# Patient Record
Sex: Female | Born: 1955
Health system: Southern US, Community
[De-identification: ages and names within clinical notes are randomized; demographics above are authoritative.]

## PROBLEM LIST (undated history)

## (undated) DIAGNOSIS — D126 Benign neoplasm of colon, unspecified: Secondary | ICD-10-CM

## (undated) DIAGNOSIS — I10 Essential (primary) hypertension: Secondary | ICD-10-CM

## (undated) DIAGNOSIS — A599 Trichomoniasis, unspecified: Secondary | ICD-10-CM

## (undated) DIAGNOSIS — M1990 Unspecified osteoarthritis, unspecified site: Secondary | ICD-10-CM

## (undated) HISTORY — DX: Benign neoplasm of colon, unspecified: D12.6

## (undated) HISTORY — PX: WISDOM TOOTH EXTRACTION: SHX21

## (undated) HISTORY — DX: Trichomoniasis, unspecified: A59.9

## (undated) HISTORY — PX: COLONOSCOPY: SHX174

---

## 1989-02-11 HISTORY — PX: ABDOMINAL HYSTERECTOMY: SHX81

## 2008-12-23 ENCOUNTER — Other Ambulatory Visit: Admission: RE | Admit: 2008-12-23 | Discharge: 2008-12-23 | Payer: Self-pay | Admitting: Gynecology

## 2008-12-23 ENCOUNTER — Encounter: Payer: Self-pay | Admitting: Gynecology

## 2008-12-23 ENCOUNTER — Ambulatory Visit: Payer: Self-pay | Admitting: Gynecology

## 2009-01-19 ENCOUNTER — Encounter: Admission: RE | Admit: 2009-01-19 | Discharge: 2009-01-19 | Payer: Self-pay | Admitting: Gynecology

## 2009-02-11 DIAGNOSIS — D126 Benign neoplasm of colon, unspecified: Secondary | ICD-10-CM

## 2009-02-11 HISTORY — DX: Benign neoplasm of colon, unspecified: D12.6

## 2009-03-17 ENCOUNTER — Ambulatory Visit (HOSPITAL_COMMUNITY): Admission: RE | Admit: 2009-03-17 | Discharge: 2009-03-17 | Payer: Self-pay | Admitting: Gastroenterology

## 2009-04-14 ENCOUNTER — Other Ambulatory Visit: Admission: RE | Admit: 2009-04-14 | Discharge: 2009-04-14 | Payer: Self-pay | Admitting: Gynecology

## 2009-04-14 ENCOUNTER — Ambulatory Visit: Payer: Self-pay | Admitting: Gynecology

## 2009-04-19 DIAGNOSIS — A599 Trichomoniasis, unspecified: Secondary | ICD-10-CM

## 2009-04-19 HISTORY — DX: Trichomoniasis, unspecified: A59.9

## 2009-05-22 ENCOUNTER — Ambulatory Visit: Payer: Self-pay | Admitting: Gynecology

## 2009-08-01 ENCOUNTER — Ambulatory Visit: Payer: Self-pay | Admitting: Gynecology

## 2009-12-25 ENCOUNTER — Other Ambulatory Visit: Admission: RE | Admit: 2009-12-25 | Discharge: 2009-12-25 | Payer: Self-pay | Admitting: Gynecology

## 2009-12-25 ENCOUNTER — Ambulatory Visit: Payer: Self-pay | Admitting: Gynecology

## 2010-01-22 ENCOUNTER — Encounter
Admission: RE | Admit: 2010-01-22 | Discharge: 2010-01-22 | Payer: Self-pay | Source: Home / Self Care | Attending: Gynecology | Admitting: Gynecology

## 2010-05-16 ENCOUNTER — Other Ambulatory Visit: Payer: Self-pay | Admitting: Gynecology

## 2010-05-16 ENCOUNTER — Ambulatory Visit (INDEPENDENT_AMBULATORY_CARE_PROVIDER_SITE_OTHER): Payer: 59 | Admitting: Gynecology

## 2010-05-16 DIAGNOSIS — N644 Mastodynia: Secondary | ICD-10-CM

## 2010-05-23 ENCOUNTER — Ambulatory Visit
Admission: RE | Admit: 2010-05-23 | Discharge: 2010-05-23 | Disposition: A | Discharge status: Home / Self Care | Payer: 59 | Source: Ambulatory Visit | Attending: Gynecology | Admitting: Gynecology

## 2010-05-23 DIAGNOSIS — N644 Mastodynia: Secondary | ICD-10-CM

## 2010-12-26 ENCOUNTER — Encounter: Payer: Self-pay | Admitting: Anesthesiology

## 2011-01-04 ENCOUNTER — Other Ambulatory Visit: Payer: Self-pay | Admitting: Gynecology

## 2011-01-04 DIAGNOSIS — Z1231 Encounter for screening mammogram for malignant neoplasm of breast: Secondary | ICD-10-CM

## 2011-01-07 ENCOUNTER — Other Ambulatory Visit (HOSPITAL_COMMUNITY)
Admission: RE | Admit: 2011-01-07 | Discharge: 2011-01-07 | Disposition: A | Discharge status: Home / Self Care | Payer: 59 | Source: Ambulatory Visit | Attending: Gynecology | Admitting: Gynecology

## 2011-01-07 ENCOUNTER — Encounter: Payer: Self-pay | Admitting: Gynecology

## 2011-01-07 ENCOUNTER — Ambulatory Visit (INDEPENDENT_AMBULATORY_CARE_PROVIDER_SITE_OTHER): Payer: 59 | Admitting: Gynecology

## 2011-01-07 DIAGNOSIS — R635 Abnormal weight gain: Secondary | ICD-10-CM

## 2011-01-07 DIAGNOSIS — Z860100 Personal history of colon polyps, unspecified: Secondary | ICD-10-CM

## 2011-01-07 DIAGNOSIS — Z1211 Encounter for screening for malignant neoplasm of colon: Secondary | ICD-10-CM

## 2011-01-07 DIAGNOSIS — Z23 Encounter for immunization: Secondary | ICD-10-CM

## 2011-01-07 DIAGNOSIS — K644 Residual hemorrhoidal skin tags: Secondary | ICD-10-CM

## 2011-01-07 DIAGNOSIS — Z01419 Encounter for gynecological examination (general) (routine) without abnormal findings: Secondary | ICD-10-CM | POA: Insufficient documentation

## 2011-01-07 DIAGNOSIS — E663 Overweight: Secondary | ICD-10-CM

## 2011-01-07 DIAGNOSIS — N951 Menopausal and female climacteric states: Secondary | ICD-10-CM

## 2011-01-07 DIAGNOSIS — E7800 Pure hypercholesterolemia, unspecified: Secondary | ICD-10-CM

## 2011-01-07 DIAGNOSIS — M533 Sacrococcygeal disorders, not elsewhere classified: Secondary | ICD-10-CM

## 2011-01-07 DIAGNOSIS — M5387 Other specified dorsopathies, lumbosacral region: Secondary | ICD-10-CM

## 2011-01-07 DIAGNOSIS — I10 Essential (primary) hypertension: Secondary | ICD-10-CM

## 2011-01-07 LAB — COMPREHENSIVE METABOLIC PANEL
ALT: 12 U/L (ref 0–35)
AST: 20 U/L (ref 0–37)
Albumin: 4.3 g/dL (ref 3.5–5.2)
Alkaline Phosphatase: 50 U/L (ref 39–117)
BUN: 15 mg/dL (ref 6–23)
CO2: 25 mEq/L (ref 19–32)
Calcium: 9 mg/dL (ref 8.4–10.5)
Chloride: 108 mEq/L (ref 96–112)
Creat: 0.88 mg/dL (ref 0.50–1.10)
Glucose, Bld: 98 mg/dL (ref 70–99)
Potassium: 4.3 mEq/L (ref 3.5–5.3)
Sodium: 142 mEq/L (ref 135–145)
Total Bilirubin: 0.6 mg/dL (ref 0.3–1.2)
Total Protein: 6.7 g/dL (ref 6.0–8.3)

## 2011-01-07 MED ORDER — CYCLOBENZAPRINE HCL 10 MG PO TABS: 10.0000 mg | ORAL_TABLET | Freq: Two times a day (BID) | ORAL | Status: AC | PRN

## 2011-01-07 NOTE — Progress Notes (Signed)
Addended by: Bertram Savin A on: 01/07/2011 02:55 PM   Modules accepted: Orders

## 2011-01-07 NOTE — Progress Notes (Signed)
Belinda Fowler October 06, 1955 [id number]   History:    55 y.o.  for annual exam with history of lumbosacral discomfort which has helped with the Flexeril that she takes in frequently. Dr. Clovis Riley is her primary physician which she's on July does not recall she had the lab work done. Patient with past history of total, hysterectomy with left salpingo-oophorectomy for fibroids. She also stated several years prior to that she has had history of dysplasia. She also had history trichomoniasis in 2011. Patient scheduled for mammogram next month. Patient does her monthly self breast examination. Her last bone density study was in 2010 she'll need to schedule here in the next few weeks. Review of her record again she was weighing 207 is up to 14. She is taking her calcium and vitamin D twice a day. She is also taking supplemental potassium 75 mg which she takes daily which has helped her like cramps. (Since she cannot eat bananas or several different types of fruits.)  Past medical history,surgical history, family history and social history were all reviewed and documented in the EPIC chart.  Gynecologic History Patient's last menstrual period was 01/06/1990. Contraception: Hysterectomy Last Pap: 2011. Results were: normal Last mammogram: 2011. Results were: normal  Obstetric History OB History    Grav Para Term Preterm Abortions TAB SAB Ect Mult Living   2 1 1  1  1   1      # Outc Date GA Lbr Len/2nd Wgt Sex Del Anes PTL Lv   1 TRM     F SVD  No Yes   2 SAB                ROS:  Was performed and pertinent positives and negatives are included in the history.  Exam: chaperone present  BP 132/90  Ht 5' 5.5" (1.664 m)  Wt 214 lb (97.07 kg)  BMI 35.07 kg/m2  LMP 01/06/1990  Body mass index is 35.07 kg/(m^2).  General appearance : Well developed well nourished female. No acute distress HEENT: Neck supple, trachea midline, no carotid bruits, no thyroidmegaly Lungs: Clear to auscultation,  no rhonchi or wheezes, or rib retractions  Heart: Regular rate and rhythm, no murmurs or gallops Breast:Examined in sitting and supine position were symmetrical in appearance, no palpable masses or tenderness,  no skin retraction, no nipple inversion, no nipple discharge, no skin discoloration, no axillary or supraclavicular lymphadenopathy Abdomen: no palpable masses or tenderness, no rebound or guarding Extremities: no edema or skin discoloration or tenderness  Pelvic:  Bartholin, Urethra, Skene Glands: Within normal limits             Vagina: No gross lesions or discharge  Cervix: Absent  Uterus  absent                Anus non thrombosed external hemorrhoids  Rectovaginal  normal sphincter tone without palpated masses or tenderness             Hemoccult Hemoccult packet was given to the patient is made to the office at a later date     Assessment/Plan:  55 y.o. female for annual exam overweight mildly hypertensive we'll need to follow up with her primary physician Dr. Clovis Riley. Repeat blood pressure 136/92. The following labs will be drawn today: Comprehensive metabolic panel, CBC, total cholesterol, TSH, urinalysis, and Pap smear. She was encouraged to continue her monthly self breast examination. She is to follow for mammogram next month. Her next colonoscopy is in 2 years. She is  to submit the fecal occult blood testing cards to the office for testing. She will schedule her bone density study as well. We discussed importance of lifestyle modification and regular exercise and balanced diet. She was encouraged to continue to take her calcium and vitamin D for osteoporosis as well. Patient has done well on the Divigel 0.5 mg (1%) which she applies transdermally to one arm daily. For her lumbosacral strain she was given a prescription for Flexeril 10 mg to take 1 by mouth daily to twice a day when necessary. Patient knows about the women's health initiative study and small risk of breast cancer in  patients on hormone replacement therapy. She fully understands that we'll keep her on low dose for a short amount of time i and then eventually begin to taper her off.   Ok Edwards MD, 11:45 AM 01/07/2011

## 2011-01-11 NOTE — Progress Notes (Signed)
Addended by: Aura Camps on: 01/11/2011 09:10 AM   Modules accepted: Orders

## 2011-01-18 ENCOUNTER — Ambulatory Visit (INDEPENDENT_AMBULATORY_CARE_PROVIDER_SITE_OTHER): Payer: 59 | Admitting: Gynecology

## 2011-01-18 DIAGNOSIS — Z1211 Encounter for screening for malignant neoplasm of colon: Secondary | ICD-10-CM

## 2011-01-18 DIAGNOSIS — E7800 Pure hypercholesterolemia, unspecified: Secondary | ICD-10-CM

## 2011-01-18 LAB — POC HEMOCCULT BLD/STL (OFFICE/1-CARD/DIAGNOSTIC): Fecal Occult Blood, POC: NEGATIVE

## 2011-01-31 ENCOUNTER — Ambulatory Visit
Admission: RE | Admit: 2011-01-31 | Discharge: 2011-01-31 | Disposition: A | Discharge status: Home / Self Care | Payer: 59 | Source: Ambulatory Visit | Attending: Gynecology | Admitting: Gynecology

## 2011-01-31 ENCOUNTER — Ambulatory Visit (INDEPENDENT_AMBULATORY_CARE_PROVIDER_SITE_OTHER): Payer: 59

## 2011-01-31 DIAGNOSIS — Z13820 Encounter for screening for osteoporosis: Secondary | ICD-10-CM

## 2011-01-31 DIAGNOSIS — N951 Menopausal and female climacteric states: Secondary | ICD-10-CM

## 2011-01-31 DIAGNOSIS — Z1231 Encounter for screening mammogram for malignant neoplasm of breast: Secondary | ICD-10-CM

## 2011-04-10 ENCOUNTER — Other Ambulatory Visit: Payer: Self-pay | Admitting: *Deleted

## 2011-04-10 MED ORDER — CYCLOBENZAPRINE HCL 10 MG PO TABS: 10.0000 mg | ORAL_TABLET | Freq: Two times a day (BID) | ORAL | Status: AC | PRN

## 2011-05-22 ENCOUNTER — Other Ambulatory Visit: Payer: Self-pay | Admitting: Gynecology

## 2011-12-30 ENCOUNTER — Other Ambulatory Visit: Payer: Self-pay | Admitting: Gynecology

## 2011-12-30 DIAGNOSIS — Z1231 Encounter for screening mammogram for malignant neoplasm of breast: Secondary | ICD-10-CM

## 2012-01-16 ENCOUNTER — Ambulatory Visit (INDEPENDENT_AMBULATORY_CARE_PROVIDER_SITE_OTHER): Payer: 59 | Admitting: Gynecology

## 2012-01-16 ENCOUNTER — Encounter: Payer: 59 | Admitting: Gynecology

## 2012-01-16 ENCOUNTER — Encounter: Payer: Self-pay | Admitting: Gynecology

## 2012-01-16 VITALS — BP 146/94 | Ht 65.25 in | Wt 209.0 lb

## 2012-01-16 DIAGNOSIS — Z01419 Encounter for gynecological examination (general) (routine) without abnormal findings: Secondary | ICD-10-CM

## 2012-01-16 DIAGNOSIS — E663 Overweight: Secondary | ICD-10-CM

## 2012-01-16 DIAGNOSIS — N952 Postmenopausal atrophic vaginitis: Secondary | ICD-10-CM

## 2012-01-16 DIAGNOSIS — Z79890 Hormone replacement therapy (postmenopausal): Secondary | ICD-10-CM

## 2012-01-16 DIAGNOSIS — I10 Essential (primary) hypertension: Secondary | ICD-10-CM | POA: Insufficient documentation

## 2012-01-16 MED ORDER — NONFORMULARY OR COMPOUNDED ITEM: Status: DC

## 2012-01-16 MED ORDER — HYDROCHLOROTHIAZIDE 12.5 MG PO CAPS: 12.5000 mg | ORAL_CAPSULE | Freq: Every day | ORAL | Status: DC

## 2012-01-16 NOTE — Addendum Note (Signed)
Addended by: Ok Edwards on: 01/16/2012 11:28 AM   Modules accepted: Orders

## 2012-01-16 NOTE — Progress Notes (Addendum)
Belinda Fowler [birth year] [id number]   History:    56 y.o.  for annual gyn exam with no complaints. Her primary physician is Dr. Clovis Riley who did her lab work in September of this year. Review of patient's records indicated she had colonic polyps in 2011 and will be scheduled for followup next year. She stopped the Divigel 4 months ago due to the cost. She does have vaginal dryness and irritation in minimal vasomotor symptoms. She is taking calcium and vitamin D twice a day for osteoporosis prevention and her last bone density study was in 2012. She scheduled for followup mammogram in January of this coming year. Patient stated that before she began seeing me back in 1981 she had cryo-of her cervix for mild dysplasia but subsequent Pap smears have been normal. Patient has a history of total abdominal hysterectomy for fibroids several years ago at another facility but patient does not remember which ovary was removed or not. Her blood pressure today was found to be elevated as follows 146/94 and on repeat 140/92. She has had history of borderline hypertension and has strong family history as well as cardiovascular disease.  Past medical history,surgical history, family history and social history were all reviewed and documented in the EPIC chart.  Gynecologic History Patient's last menstrual period was 01/06/1990. Contraception: status post hysterectomy Last Pap: 2012. Results were: normal Last mammogram: 2012. Results were: normal  Obstetric History OB History    Grav Para Term Preterm Abortions TAB SAB Ect Mult Living   2 1 1  1  1   1      # Outc Date GA Lbr Len/2nd Wgt Sex Del Anes PTL Lv   1 TRM     F SVD  No Yes   2 SAB                ROS: A ROS was performed and pertinent positives and negatives are included in the history.  GENERAL: No fevers or chills. HEENT: No change in vision, no earache, sore throat or sinus congestion. NECK: No pain or stiffness. CARDIOVASCULAR: No chest  pain or pressure. No palpitations. PULMONARY: No shortness of breath, cough or wheeze. GASTROINTESTINAL: No abdominal pain, nausea, vomiting or diarrhea, melena or bright red blood per rectum. GENITOURINARY: No urinary frequency, urgency, hesitancy or dysuria. MUSCULOSKELETAL: No joint or muscle pain, no back pain, no recent trauma. DERMATOLOGIC: No rash, no itching, no lesions. ENDOCRINE: No polyuria, polydipsia, no heat or cold intolerance. No recent change in weight. HEMATOLOGICAL: No anemia or easy bruising or bleeding. NEUROLOGIC: No headache, seizures, numbness, tingling or weakness. PSYCHIATRIC: No depression, no loss of interest in normal activity or change in sleep pattern.     Exam: chaperone present  BP 146/94  Ht 5' 5.25" (1.657 m)  Wt 209 lb (94.802 kg)  BMI 34.51 kg/m2  LMP 01/06/1990  Body mass index is 34.51 kg/(m^2).  General appearance : Well developed well nourished female. No acute distress HEENT: Neck supple, trachea midline, no carotid bruits, no thyroidmegaly Lungs: Clear to auscultation, no rhonchi or wheezes, or rib retractions  Heart: Regular rate and rhythm, no murmurs or gallops Breast:Examined in sitting and supine position were symmetrical in appearance, no palpable masses or tenderness,  no skin retraction, no nipple inversion, no nipple discharge, no skin discoloration, no axillary or supraclavicular lymphadenopathy Abdomen: no palpable masses or tenderness, no rebound or guarding Extremities: no edema or skin discoloration or tenderness  Pelvic:  Bartholin, Urethra, Skene Glands: Within normal limits  Vagina: No gross lesions or discharge  Cervix: Absent  Uterus  absent  Adnexa  Without masses or tenderness  Anus and perineum  normal   Rectovaginal  normal sphincter tone without palpated masses or tenderness             Hemoccult card provided     Assessment/Plan:  56 y.o. female for annual exam who is overweight with a BMI 34.51 and  hypertensive. We are going to start her on HCTZ 12.5 mg daily. I've asked her to maintain a log of her blood pressure readings daily and to followup with her internist Dr. Clovis Riley next week. No Pap smear done today. New Pap smear screening guidelines discussed. Limited pelvic exam due to patient's abdominal girth and slight vaginismus son ultrasound will be scheduled the next couple weeks. We'll schedule a bone density study at the end of next year. She was reminded to followup with her mammogram. Literature information on cholesterol-lowering diet and exercise was provided. Patient's flu vaccine up-to-date. Patient will be started on estradiol 0.02% 1 mL pre-field applicator to apply twice a week vaginally for atrophy. We discussed the women's health initiative study and slight risk of breast cancer in patients on any form of estrogen replacement therapy patient understands and accepts.    Ok Edwards MD, 11:21 AM 01/16/2012

## 2012-01-16 NOTE — Patient Instructions (Addendum)
Cholesterol Control Diet  Cholesterol levels in your body are determined significantly by your diet. Cholesterol levels may also be related to heart disease. The following material helps to explain this relationship and discusses what you can do to help keep your heart healthy. Not all cholesterol is bad. Low-density lipoprotein (LDL) cholesterol is the "bad" cholesterol. It may cause fatty deposits to build up inside your arteries. High-density lipoprotein (HDL) cholesterol is "good." It helps to remove the "bad" LDL cholesterol from your blood. Cholesterol is a very important risk factor for heart disease. Other risk factors are high blood pressure, smoking, stress, heredity, and weight. The heart muscle gets its supply of blood through the coronary arteries. If your LDL cholesterol is high and your HDL cholesterol is low, you are at risk for having fatty deposits build up in your coronary arteries. This leaves less room through which blood can flow. Without sufficient blood and oxygen, the heart muscle cannot function properly and you may feel chest pains (angina pectoris). When a coronary artery closes up entirely, a part of the heart muscle may die, causing a heart attack (myocardial infarction). CHECKING CHOLESTEROL When your caregiver sends your blood to a lab to be analyzed for cholesterol, a complete lipid (fat) profile may be done. With this test, the total amount of cholesterol and levels of LDL and HDL are determined. Triglycerides are a type of fat that circulates in the blood and can also be used to determine heart disease risk. The list below describes what the numbers should be: Test: Total Cholesterol.  Less than 200 mg/dl.  Test: LDL "bad cholesterol."  Less than 100 mg/dl.   Less than 70 mg/dl if you are at very high risk of a heart attack or sudden cardiac death.  Test: HDL "good cholesterol."  Greater than 50 mg/dl for women.    Greater than 40 mg/dl for men.  Test: Triglycerides.  Less than 150 mg/dl.  CONTROLLING CHOLESTEROL WITH DIET Although exercise and lifestyle factors are important, your diet is key. That is because certain foods are known to raise cholesterol and others to lower it. The goal is to balance foods for their effect on cholesterol and more importantly, to replace saturated and trans fat with other types of fat, such as monounsaturated fat, polyunsaturated fat, and omega-3 fatty acids. On average, a person should consume no more than 15 to 17 g of saturated fat daily. Saturated and trans fats are considered "bad" fats, and they will raise LDL cholesterol. Saturated fats are primarily found in animal products such as meats, butter, and cream. However, that does not mean you need to sacrifice all your favorite foods. Today, there are good tasting, low-fat, low-cholesterol substitutes for most of the things you like to eat. Choose low-fat or nonfat alternatives. Choose round or loin cuts of red meat, since these types of cuts are lowest in fat and cholesterol. Chicken (without the skin), fish, veal, and ground [place] breast are excellent choices. Eliminate fatty meats, such as hot dogs and salami. Even shellfish have little or no saturated fat. Have a 3 oz (85 g) portion when you eat lean meat, poultry, or fish. Trans fats are also called "partially hydrogenated oils." They are oils that have been scientifically manipulated so that they are solid at room temperature resulting in a longer shelf life and improved taste and texture of foods in which they are added. Trans fats are found in stick margarine, some tub margarines, cookies, crackers, and baked goods.  When baking and cooking, oils are an excellent substitute for butter. The monounsaturated oils are especially beneficial since it is believed they lower LDL and raise HDL. The oils you should avoid entirely are saturated tropical oils, such as coconut and  palm.  Remember to eat liberally from food groups that are naturally free of saturated and trans fat, including fish, fruit, vegetables, beans, grains (barley, rice, couscous, bulgur wheat), and pasta (without cream sauces).  IDENTIFYING FOODS THAT LOWER CHOLESTEROL  Soluble fiber may lower your cholesterol. This type of fiber is found in fruits such as apples, vegetables such as broccoli, potatoes, and carrots, legumes such as beans, peas, and lentils, and grains such as barley. Foods fortified with plant sterols (phytosterol) may also lower cholesterol. You should eat at least 2 g per day of these foods for a cholesterol lowering effect.  Read package labels to identify low-saturated fats, trans fats free, and low-fat foods at the supermarket. Select cheeses that have only 2 to 3 g saturated fat per ounce. Use a heart-healthy tub margarine that is free of trans fats or partially hydrogenated oil. When buying baked goods (cookies, crackers), avoid partially hydrogenated oils. Breads and muffins should be made from whole grains (whole-wheat or whole oat flour, instead of "flour" or "enriched flour"). Buy non-creamy canned soups with reduced salt and no added fats.  FOOD PREPARATION TECHNIQUES  Never deep-fry. If you must fry, either stir-fry, which uses very little fat, or use non-stick cooking sprays. When possible, broil, bake, or roast meats, and steam vegetables. Instead of dressing vegetables with butter or margarine, use lemon and herbs, applesauce and cinnamon (for squash and sweet potatoes), nonfat yogurt, salsa, and low-fat dressings for salads.  LOW-SATURATED FAT / LOW-FAT FOOD SUBSTITUTES Meats / Saturated Fat (g)  Avoid: Steak, marbled (3 oz/85 g) / 11 g   Choose: Steak, lean (3 oz/85 g) / 4 g   Avoid: Hamburger (3 oz/85 g) / 7 g   Choose: Hamburger, lean (3 oz/85 g) / 5 g   Avoid: Ham (3 oz/85 g) / 6 g   Choose: Ham, lean cut (3 oz/85 g) / 2.4 g   Avoid: Chicken, with skin, dark  meat (3 oz/85 g) / 4 g   Choose: Chicken, skin removed, dark meat (3 oz/85 g) / 2 g   Avoid: Chicken, with skin, light meat (3 oz/85 g) / 2.5 g   Choose: Chicken, skin removed, light meat (3 oz/85 g) / 1 g  Dairy / Saturated Fat (g)  Avoid: Whole milk (1 cup) / 5 g   Choose: Low-fat milk, 2% (1 cup) / 3 g   Choose: Low-fat milk, 1% (1 cup) / 1.5 g   Choose: Skim milk (1 cup) / 0.3 g   Avoid: Hard cheese (1 oz/28 g) / 6 g   Choose: Skim milk cheese (1 oz/28 g) / 2 to 3 g   Avoid: Cottage cheese, 4% fat (1 cup) / 6.5 g   Choose: Low-fat cottage cheese, 1% fat (1 cup) / 1.5 g   Avoid: Ice cream (1 cup) / 9 g   Choose: Sherbet (1 cup) / 2.5 g   Choose: Nonfat frozen yogurt (1 cup) / 0.3 g   Choose: Frozen fruit bar / trace   Avoid: Whipped cream (1 tbs) / 3.5 g   Choose: Nondairy whipped topping (1 tbs) / 1 g  Condiments / Saturated Fat (g)  Avoid: Mayonnaise (1 tbs) / 2 g   Choose: Low-fat  mayonnaise (1 tbs) / 1 g   Avoid: Butter (1 tbs) / 7 g   Choose: Extra light margarine (1 tbs) / 1 g   Avoid: Coconut oil (1 tbs) / 11.8 g   Choose: Olive oil (1 tbs) / 1.8 g   Choose: Corn oil (1 tbs) / 1.7 g   Choose: Safflower oil (1 tbs) / 1.2 g   Choose: Sunflower oil (1 tbs) / 1.4 g   Choose: Soybean oil (1 tbs) / 2.4 g   Choose: Canola oil (1 tbs) / 1 g  Document Released: 01/28/2005 Document Revised: 10/10/2010 Document Reviewed: 07/19/2010 [facility] Patient Information 2012 [place], [place].  Exercise to Lose Weight Exercise and a healthy diet may help you lose weight. Your doctor may suggest specific exercises. EXERCISE IDEAS AND TIPS  Choose low-cost things you enjoy doing, such as walking, bicycling, or exercising to workout videos.   Take stairs instead of the elevator.   Walk during your lunch break.   Park your car further away from work or school.   Go to a gym or an exercise class.   Start with 5 to 10 minutes of exercise each day. Build up to  30 minutes of exercise 4 to 6 days a week.   Wear shoes with good support and comfortable clothes.   Stretch before and after working out.   Work out until you breathe harder and your heart beats faster.   Drink extra water when you exercise.   Do not do so much that you hurt yourself, feel dizzy, or get very short of breath.  Exercises that burn about 150 calories:  Running 1  miles in 15 minutes.   Playing volleyball for 45 to 60 minutes.   Washing and waxing a car for 45 to 60 minutes.   Playing touch football for 45 minutes.   Walking 1  miles in 35 minutes.   Pushing a stroller 1  miles in 30 minutes.   Playing basketball for 30 minutes.   Raking leaves for 30 minutes.   Bicycling 5 miles in 30 minutes.   Walking 2 miles in 30 minutes.   Dancing for 30 minutes.   Shoveling snow for 15 minutes.   Swimming laps for 20 minutes.   Walking up stairs for 15 minutes.   Bicycling 4 miles in 15 minutes.   Gardening for 30 to 45 minutes.   Jumping rope for 15 minutes.   Washing windows or floors for 45 to 60 minutes.  Document Released: 03/02/2010 Document Revised: 10/10/2010 Document Reviewed: 03/02/2010 [facility] Patient Information 2012 [place], [place]. Hypertension As your heart beats, it forces blood through your arteries. This force is your blood pressure. If the pressure is too high, it is called hypertension (HTN) or high blood pressure. HTN is dangerous because you may have it and not know it. High blood pressure may mean that your heart has to work harder to pump blood. Your arteries may be narrow or stiff. The extra work puts you at risk for heart disease, stroke, and other problems.  Blood pressure consists of two numbers, a higher number over a lower, 110/72, for example. It is stated as "110 over 72." The ideal is below 120 for the top number (systolic) and under 80 for the bottom (diastolic). Write down your blood pressure today. You should pay close  attention to your blood pressure if you have certain conditions such as:  Heart failure.  Prior heart attack.  Diabetes  Chronic kidney  disease.  Prior stroke.  Multiple risk factors for heart disease. To see if you have HTN, your blood pressure should be measured while you are seated with your arm held at the level of the heart. It should be measured at least twice. A one-time elevated blood pressure reading (especially in the Emergency Department) does not mean that you need treatment. There may be conditions in which the blood pressure is different between your right and left arms. It is important to see your caregiver soon for a recheck. Most people have essential hypertension which means that there is not a specific cause. This type of high blood pressure may be lowered by changing lifestyle factors such as:  Stress.  Smoking.  Lack of exercise.  Excessive weight.  Drug/tobacco/alcohol use.  Eating less salt. Most people do not have symptoms from high blood pressure until it has caused damage to the body. Effective treatment can often prevent, delay or reduce that damage. TREATMENT  When a cause has been identified, treatment for high blood pressure is directed at the cause. There are a large number of medications to treat HTN. These fall into several categories, and your caregiver will help you select the medicines that are best for you. Medications may have side effects. You should review side effects with your caregiver. If your blood pressure stays high after you have made lifestyle changes or started on medicines,   Your medication(s) may need to be changed.  Other problems may need to be addressed.  Be certain you understand your prescriptions, and know how and when to take your medicine.  Be sure to follow up with your caregiver within the time frame advised (usually within two weeks) to have your blood pressure rechecked and to review your medications.  If you are  taking more than one medicine to lower your blood pressure, make sure you know how and at what times they should be taken. Taking two medicines at the same time can result in blood pressure that is too low. SEEK IMMEDIATE MEDICAL CARE IF:  You develop a severe headache, blurred or changing vision, or confusion.  You have unusual weakness or numbness, or a faint feeling.  You have severe chest or abdominal pain, vomiting, or breathing problems. MAKE SURE YOU:   Understand these instructions.  Will watch your condition.  Will get help right away if you are not doing well or get worse. Document Released: 01/28/2005 Document Revised: 04/22/2011 Document Reviewed: 09/18/2007 [facility] Patient Information 2013 [place], [place].

## 2012-01-24 ENCOUNTER — Ambulatory Visit (INDEPENDENT_AMBULATORY_CARE_PROVIDER_SITE_OTHER): Payer: 59

## 2012-01-24 ENCOUNTER — Encounter: Payer: Self-pay | Admitting: Gynecology

## 2012-01-24 ENCOUNTER — Ambulatory Visit (INDEPENDENT_AMBULATORY_CARE_PROVIDER_SITE_OTHER): Payer: 59 | Admitting: Gynecology

## 2012-01-24 VITALS — BP 120/78

## 2012-01-24 DIAGNOSIS — Q5039 Other congenital malformation of ovary: Secondary | ICD-10-CM

## 2012-01-24 DIAGNOSIS — N942 Vaginismus: Secondary | ICD-10-CM

## 2012-01-24 DIAGNOSIS — E663 Overweight: Secondary | ICD-10-CM

## 2012-01-24 NOTE — Progress Notes (Signed)
Patient is a 56 year old was seen the office for annual exam on December 5 see previous encounter note for details. She is here today for followup ultrasound due to the fact that at the time of her annual exam due to her vaginismus a thorough pelvic exam was not possible. Several years ago she had had a hysterectomy in another facility and had her right tube and ovary removed.  Ultrasound today vaginal cuff and right adnexa are normal. Absence of right ovary. 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  The findings were discussed with patient. This appears to be small benign calcifications on her left ovary but nevertheless I would life her to followup with an ultrasound in 6 months

## 2012-02-12 DIAGNOSIS — Z1211 Encounter for screening for malignant neoplasm of colon: Secondary | ICD-10-CM

## 2012-02-13 ENCOUNTER — Ambulatory Visit
Admission: RE | Admit: 2012-02-13 | Discharge: 2012-02-13 | Disposition: A | Discharge status: Home / Self Care | Payer: 59 | Source: Ambulatory Visit | Attending: Gynecology | Admitting: Gynecology

## 2012-02-13 ENCOUNTER — Other Ambulatory Visit: Payer: Self-pay | Admitting: Anesthesiology

## 2012-02-13 DIAGNOSIS — Z1231 Encounter for screening mammogram for malignant neoplasm of breast: Secondary | ICD-10-CM

## 2012-02-13 DIAGNOSIS — Z1211 Encounter for screening for malignant neoplasm of colon: Secondary | ICD-10-CM

## 2012-02-17 ENCOUNTER — Other Ambulatory Visit: Payer: Self-pay | Admitting: Gynecology

## 2012-02-17 DIAGNOSIS — Z1211 Encounter for screening for malignant neoplasm of colon: Secondary | ICD-10-CM

## 2013-01-26 ENCOUNTER — Other Ambulatory Visit: Payer: Self-pay

## 2013-01-26 DIAGNOSIS — Z1231 Encounter for screening mammogram for malignant neoplasm of breast: Secondary | ICD-10-CM

## 2013-02-16 ENCOUNTER — Ambulatory Visit
Admission: RE | Admit: 2013-02-16 | Discharge: 2013-02-16 | Disposition: A | Discharge status: Home / Self Care | Payer: 59 | Source: Ambulatory Visit

## 2013-02-16 DIAGNOSIS — Z1231 Encounter for screening mammogram for malignant neoplasm of breast: Secondary | ICD-10-CM

## 2013-02-26 ENCOUNTER — Encounter: Payer: Self-pay | Admitting: Gynecology

## 2013-02-26 ENCOUNTER — Ambulatory Visit (INDEPENDENT_AMBULATORY_CARE_PROVIDER_SITE_OTHER): Payer: 59 | Admitting: Gynecology

## 2013-02-26 VITALS — BP 142/90 | Ht 65.25 in | Wt 186.2 lb

## 2013-02-26 DIAGNOSIS — Z01419 Encounter for gynecological examination (general) (routine) without abnormal findings: Secondary | ICD-10-CM

## 2013-02-26 DIAGNOSIS — N952 Postmenopausal atrophic vaginitis: Secondary | ICD-10-CM

## 2013-02-26 DIAGNOSIS — Z780 Asymptomatic menopausal state: Secondary | ICD-10-CM

## 2013-02-26 DIAGNOSIS — N951 Menopausal and female climacteric states: Secondary | ICD-10-CM

## 2013-02-26 MED ORDER — HYDROCHLOROTHIAZIDE 12.5 MG PO CAPS: 12.5000 mg | ORAL_CAPSULE | Freq: Every day | ORAL | Status: DC

## 2013-02-26 MED ORDER — NONFORMULARY OR COMPOUNDED ITEM: Status: DC

## 2013-02-26 NOTE — Progress Notes (Signed)
Belinda Fowler 1955/12/09 [id number]   History:    58 y.o.  for annual gyn exam with no major complaints today. Patient has been followed by Dr. Donnie Coffin her PCP for which she is scheduled to have her blood work drawn next week. Patient has been actively exercising and eating healthier she was weighing 209 pounds last year and his weight 186 pounds today. Review of patient's records indicated that she has a history of colonic polyps removed in 2011. She is currently doing well on estradiol 0.02% vaginal cream twice a week for vaginal atrophy and now has no weighs a motor symptoms.Patient stated that before she began seeing me back in 1981 she had cryo-of her cervix for mild dysplasia but subsequent Pap smears have been normal. Patient has a history of total abdominal hysterectomy for fibroids several years ago at another facility but patient does not remember which ovary was removed or not.  Patient had an ultrasound 01/23/2002 the following was noted: vaginal cuff and right adnexa are normal. Absence of right ovary. 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  The patient's flu vaccine is up-to-date.  Past medical history,surgical history, family history and social history were all reviewed and documented in the EPIC chart.  Gynecologic History Patient's last menstrual period was 01/06/1990. Contraception: status post hysterectomy Last Pap: 2012. Results were: normal Last mammogram: 2015. Results were: normal  Obstetric History OB History  Gravida Para Term Preterm AB SAB TAB Ectopic Multiple Living  2 1 1  1 1    1     # Outcome Date GA Lbr Len/2nd Weight Sex Delivery Anes PTL Lv  2 SAB           1 TRM     F SVD  N Y       ROS: A ROS was performed and pertinent positives and negatives are included in the history.  GENERAL: No fevers or chills. HEENT: No change in vision, no earache, sore throat or sinus  congestion. NECK: No pain or stiffness. CARDIOVASCULAR: No chest pain or pressure. No palpitations. PULMONARY: No shortness of breath, cough or wheeze. GASTROINTESTINAL: No abdominal pain, nausea, vomiting or diarrhea, melena or bright red blood per rectum. GENITOURINARY: No urinary frequency, urgency, hesitancy or dysuria. MUSCULOSKELETAL: No joint or muscle pain, no back pain, no recent trauma. DERMATOLOGIC: No rash, no itching, no lesions. ENDOCRINE: No polyuria, polydipsia, no heat or cold intolerance. No recent change in weight. HEMATOLOGICAL: No anemia or easy bruising or bleeding. NEUROLOGIC: No headache, seizures, numbness, tingling or weakness. PSYCHIATRIC: No depression, no loss of interest in normal activity or change in sleep pattern.     Exam: chaperone present  BP 142/90  Ht 5' 5.25" (1.657 m)  Wt 186 lb 3.2 oz (84.46 kg)  BMI 30.76 kg/m2  LMP 01/06/1990  Body mass index is 30.76 kg/(m^2).  General appearance : Well developed well nourished female. No acute distress HEENT: Neck supple, trachea midline, no carotid bruits, no thyroidmegaly Lungs: Clear to auscultation, no rhonchi or wheezes, or rib retractions  Heart: Regular rate and rhythm, no murmurs or gallops Breast:Examined in sitting and supine position were symmetrical in appearance, no palpable masses or tenderness,  no skin retraction, no nipple inversion, no nipple discharge, no skin discoloration, no axillary or supraclavicular lymphadenopathy Abdomen: no palpable masses or tenderness, no rebound or guarding Extremities: no edema or skin discoloration or tenderness  Pelvic:  Bartholin, Urethra, Skene Glands: Within normal limits             Vagina: No gross lesions or discharge  Cervix:  absence   Uterus absent  Adnexa  Without masses or tenderness  Anus and perineum  normal   Rectovaginal  normal sphincter tone without palpated masses or tenderness  Physical Exam  Genitourinary:                    Hemoccult cards provided     Assessment/Plan:  58 y.o. female for annual exam doing well on vaginal estrogen cream twice a week for vaginal atrophy and vasomotor symptoms. Patient will make an appointment the next few weeks to have the perirectal/perineal lesion removed which she states has been there for many years but is causing her discomfort. She is scheduled for colonoscopy sometime later this year or next year. PCP will be drawn her blood work. Pap smear not done in accordance to new guidelines. Patient was scheduled bone density study. We discussed importance of calcium vitamin D and continuation of her exercise and she is doing well and losing weight. She states that her systolic blood pressures at home have been in the 098J and her diastolics have been in the 65s. She was reminded of the importance of monthly breast exams.  Note: This dictation was prepared with  Dragon/digital dictation along withSmart phrase technology. Any transcriptional errors that result from this process are unintentional.   Terrance Mass MD, 9:17 AM 02/26/2013

## 2013-02-26 NOTE — Patient Instructions (Signed)
Bone Densitometry Bone densitometry is a special X-ray that measures your bone density and can be used to help predict your risk of bone fractures. This test is used to determine bone mineral content and density to diagnose osteoporosis. Osteoporosis is the loss of bone that may cause the bone to become weak. Osteoporosis commonly occurs in women entering menopause. However, it may be found in men and in people with other diseases. PREPARATION FOR TEST No preparation necessary. WHO SHOULD BE TESTED?  All women older than 65.  Postmenopausal women (50 to 65) with risk factors for osteoporosis.  People with a previous fracture caused by normal activities.  People with a small body frame (less than 127 poundsor a body mass index [BMI] of less than 21).  People who have a parent with a hip fracture or history of osteoporosis.  People who smoke.  People who have rheumatoid arthritis.  Anyone who engages in excessive alcohol use (more than 3 drinks most days).  Women who experience early menopause. WHEN SHOULD YOU BE RETESTED? Current guidelines suggest that you should wait at least 2 years before doing a bone density test again if your first test was normal.Recent studies indicated that women with normal bone density may be able to wait a few years before needing to repeat a bone density test. You should discuss this with your caregiver.  NORMAL FINDINGS   Normal: less than standard deviation below normal (greater than -1).  Osteopenia: 1 to 2.5 standard deviations below normal (-1 to -2.5).  Osteoporosis: greater than 2.5 standard deviations below normal (less than -2.5). Test results are reported as a "T score" and a "Z score."The T score is a number that compares your bone density with the bone density of healthy, young women.The Z score is a number that compares your bone density with the scores of women who are the same age, gender, and race.  Ranges for normal findings may vary  among different laboratories and hospitals. You should always check with your doctor after having lab work or other tests done to discuss the meaning of your test results and whether your values are considered within normal limits. MEANING OF TEST  Your caregiver will go over the test results with you and discuss the importance and meaning of your results, as well as treatment options and the need for additional tests if necessary. OBTAINING THE TEST RESULTS It is your responsibility to obtain your test results. Ask the lab or department performing the test when and how you will get your results. Document Released: 02/20/2004 Document Revised: 04/22/2011 Document Reviewed: 03/14/2010 [employer] Patient Information 2014 [employer].  

## 2013-03-19 ENCOUNTER — Other Ambulatory Visit: Payer: 59 | Admitting: Anesthesiology

## 2013-03-19 DIAGNOSIS — Z1211 Encounter for screening for malignant neoplasm of colon: Secondary | ICD-10-CM

## 2013-05-03 ENCOUNTER — Other Ambulatory Visit: Payer: Self-pay | Admitting: Gynecology

## 2013-05-03 ENCOUNTER — Ambulatory Visit (INDEPENDENT_AMBULATORY_CARE_PROVIDER_SITE_OTHER): Payer: 59

## 2013-05-03 DIAGNOSIS — Z780 Asymptomatic menopausal state: Secondary | ICD-10-CM

## 2013-05-03 DIAGNOSIS — Z13820 Encounter for screening for osteoporosis: Secondary | ICD-10-CM

## 2013-09-25 ENCOUNTER — Observation Stay (HOSPITAL_COMMUNITY)
Admission: EM | Admit: 2013-09-25 | Discharge: 2013-09-26 | Disposition: A | Discharge status: Home / Self Care | Payer: 59 | Attending: Internal Medicine | Admitting: Internal Medicine

## 2013-09-25 ENCOUNTER — Encounter (HOSPITAL_COMMUNITY): Payer: Self-pay | Admitting: Emergency Medicine

## 2013-09-25 ENCOUNTER — Emergency Department (HOSPITAL_COMMUNITY): Payer: 59

## 2013-09-25 DIAGNOSIS — E876 Hypokalemia: Secondary | ICD-10-CM | POA: Insufficient documentation

## 2013-09-25 DIAGNOSIS — E7800 Pure hypercholesterolemia, unspecified: Secondary | ICD-10-CM | POA: Diagnosis not present

## 2013-09-25 DIAGNOSIS — Z91040 Latex allergy status: Secondary | ICD-10-CM | POA: Insufficient documentation

## 2013-09-25 DIAGNOSIS — R079 Chest pain, unspecified: Secondary | ICD-10-CM | POA: Diagnosis present

## 2013-09-25 DIAGNOSIS — Z8249 Family history of ischemic heart disease and other diseases of the circulatory system: Secondary | ICD-10-CM | POA: Insufficient documentation

## 2013-09-25 DIAGNOSIS — I10 Essential (primary) hypertension: Secondary | ICD-10-CM | POA: Diagnosis present

## 2013-09-25 DIAGNOSIS — Z90710 Acquired absence of both cervix and uterus: Secondary | ICD-10-CM | POA: Diagnosis not present

## 2013-09-25 DIAGNOSIS — E663 Overweight: Secondary | ICD-10-CM | POA: Diagnosis present

## 2013-09-25 LAB — HEMOGLOBIN A1C
Hgb A1c MFr Bld: 5.6 % (ref <5.7)
Mean Plasma Glucose: 114 mg/dL (ref <117)

## 2013-09-25 LAB — CBC WITH DIFFERENTIAL/PLATELET
Basophils Absolute: 0 10*3/uL (ref 0.0–0.1)
Basophils Relative: 0 % (ref 0–1)
Eosinophils Absolute: 0.1 10*3/uL (ref 0.0–0.7)
Eosinophils Relative: 1 % (ref 0–5)
HCT: 37.5 % (ref 36.0–46.0)
Hemoglobin: 13 g/dL (ref 12.0–15.0)
Lymphocytes Relative: 51 % — ABNORMAL HIGH (ref 12–46)
Lymphs Abs: 4.7 10*3/uL — ABNORMAL HIGH (ref 0.7–4.0)
MCH: 31.1 pg (ref 26.0–34.0)
MCHC: 34.7 g/dL (ref 30.0–36.0)
MCV: 89.7 fL (ref 78.0–100.0)
Monocytes Absolute: 0.6 10*3/uL (ref 0.1–1.0)
Monocytes Relative: 6 % (ref 3–12)
Neutro Abs: 3.9 10*3/uL (ref 1.7–7.7)
Neutrophils Relative %: 42 % — ABNORMAL LOW (ref 43–77)
Platelets: 290 10*3/uL (ref 150–400)
RBC: 4.18 MIL/uL (ref 3.87–5.11)
RDW: 14 % (ref 11.5–15.5)
WBC: 9.3 10*3/uL (ref 4.0–10.5)

## 2013-09-25 LAB — D-DIMER, QUANTITATIVE: D-Dimer, Quant: <0.27 ug/mL-FEU (ref 0.00–0.48)

## 2013-09-25 LAB — COMPREHENSIVE METABOLIC PANEL
ALT: 15 U/L (ref 0–35)
AST: 18 U/L (ref 0–37)
Albumin: 4.1 g/dL (ref 3.5–5.2)
Alkaline Phosphatase: 54 U/L (ref 39–117)
Anion gap: 13 (ref 5–15)
BUN: 14 mg/dL (ref 6–23)
CO2: 28 mEq/L (ref 19–32)
Calcium: 9.6 mg/dL (ref 8.4–10.5)
Chloride: 102 mEq/L (ref 96–112)
Creatinine, Ser: 0.76 mg/dL (ref 0.50–1.10)
GFR calc Af Amer: >90 mL/min (ref >90)
GFR calc non Af Amer: >90 mL/min (ref >90)
Glucose, Bld: 104 mg/dL — ABNORMAL HIGH (ref 70–99)
Potassium: 3.6 mEq/L — ABNORMAL LOW (ref 3.7–5.3)
Sodium: 143 mEq/L (ref 137–147)
Total Bilirubin: 0.4 mg/dL (ref 0.3–1.2)
Total Protein: 7.9 g/dL (ref 6.0–8.3)

## 2013-09-25 LAB — TROPONIN I
Troponin I: <0.3 ng/mL (ref <0.30)
Troponin I: <0.3 ng/mL (ref <0.30)

## 2013-09-25 LAB — CK: Total CK: 137 U/L (ref 7–177)

## 2013-09-25 MED ORDER — DOCUSATE SODIUM 100 MG PO CAPS
100.0000 mg | ORAL_CAPSULE | Freq: Two times a day (BID) | ORAL | Status: DC
  Administered 2013-09-25 – 2013-09-26 (×2): 100 mg via ORAL
  Filled 2013-09-25 (×3): qty 1

## 2013-09-25 MED ORDER — HYDROCHLOROTHIAZIDE 12.5 MG PO CAPS
12.5000 mg | ORAL_CAPSULE | Freq: Every day | ORAL | Status: DC
  Administered 2013-09-26: 12.5 mg via ORAL
  Filled 2013-09-25: qty 1

## 2013-09-25 MED ORDER — CALCIUM CARBONATE 1250 (500 CA) MG PO TABS
1.0000 | ORAL_TABLET | Freq: Two times a day (BID) | ORAL | Status: DC
  Administered 2013-09-25 – 2013-09-26 (×2): 500 mg via ORAL
  Filled 2013-09-25 (×4): qty 1

## 2013-09-25 MED ORDER — IBUPROFEN 200 MG PO TABS: 600.0000 mg | ORAL_TABLET | Freq: Four times a day (QID) | ORAL | Status: DC | PRN

## 2013-09-25 MED ORDER — ENOXAPARIN SODIUM 40 MG/0.4ML ~~LOC~~ SOLN
40.0000 mg | SUBCUTANEOUS | Status: DC
  Administered 2013-09-25: 40 mg via SUBCUTANEOUS
  Filled 2013-09-25 (×2): qty 0.4

## 2013-09-25 MED ORDER — ACETAMINOPHEN 650 MG RE SUPP: 650.0000 mg | Freq: Four times a day (QID) | RECTAL | Status: DC | PRN

## 2013-09-25 MED ORDER — POTASSIUM CHLORIDE CRYS ER 20 MEQ PO TBCR
40.0000 meq | EXTENDED_RELEASE_TABLET | Freq: Once | ORAL | Status: AC
  Administered 2013-09-25: 40 meq via ORAL
  Filled 2013-09-25: qty 2

## 2013-09-25 MED ORDER — HYDROCODONE-ACETAMINOPHEN 5-325 MG PO TABS
1.0000 | ORAL_TABLET | ORAL | Status: DC | PRN
  Administered 2013-09-25 – 2013-09-26 (×3): 2 via ORAL
  Filled 2013-09-25 (×3): qty 2

## 2013-09-25 MED ORDER — ONDANSETRON HCL 4 MG PO TABS: 4.0000 mg | ORAL_TABLET | Freq: Four times a day (QID) | ORAL | Status: DC | PRN

## 2013-09-25 MED ORDER — ASPIRIN EC 81 MG PO TBEC
81.0000 mg | DELAYED_RELEASE_TABLET | Freq: Every day | ORAL | Status: DC
  Administered 2013-09-26: 81 mg via ORAL
  Filled 2013-09-25: qty 1

## 2013-09-25 MED ORDER — MORPHINE SULFATE 4 MG/ML IJ SOLN
4.0000 mg | Freq: Once | INTRAMUSCULAR | Status: AC
  Administered 2013-09-25: 4 mg via INTRAVENOUS
  Filled 2013-09-25: qty 1

## 2013-09-25 MED ORDER — ASPIRIN 81 MG PO CHEW
324.0000 mg | CHEWABLE_TABLET | Freq: Once | ORAL | Status: AC
  Administered 2013-09-25: 324 mg via ORAL
  Filled 2013-09-25: qty 4

## 2013-09-25 MED ORDER — CALCIUM CARBONATE 600 MG PO TABS
600.0000 mg | ORAL_TABLET | Freq: Two times a day (BID) | ORAL | Status: DC
  Filled 2013-09-25 (×2): qty 1

## 2013-09-25 MED ORDER — ONDANSETRON HCL 4 MG/2ML IJ SOLN: 4.0000 mg | Freq: Four times a day (QID) | INTRAMUSCULAR | Status: DC | PRN

## 2013-09-25 MED ORDER — SODIUM CHLORIDE 0.9 % IJ SOLN
3.0000 mL | Freq: Two times a day (BID) | INTRAMUSCULAR | Status: DC
  Administered 2013-09-25 – 2013-09-26 (×2): 3 mL via INTRAVENOUS

## 2013-09-25 MED ORDER — ONDANSETRON HCL 4 MG/2ML IJ SOLN
4.0000 mg | Freq: Once | INTRAMUSCULAR | Status: AC
  Administered 2013-09-25: 4 mg via INTRAVENOUS
  Filled 2013-09-25: qty 2

## 2013-09-25 MED ORDER — VITAMIN E 45 MG (100 UNIT) PO CAPS
600.0000 [IU] | ORAL_CAPSULE | Freq: Every day | ORAL | Status: DC
  Administered 2013-09-25 – 2013-09-26 (×2): 600 [IU] via ORAL
  Filled 2013-09-25 (×2): qty 2

## 2013-09-25 MED ORDER — ACETAMINOPHEN 325 MG PO TABS: 650.0000 mg | ORAL_TABLET | Freq: Four times a day (QID) | ORAL | Status: DC | PRN

## 2013-09-25 NOTE — H&P (Signed)
[facility] History and Physical  Belinda Fowler OZD:[id number] DOB: 01/05/1956 DOA: 09/25/2013  Referring physician: Dr Wyvonnia Dusky PCP: Donnie Coffin, MD   Chief Complaint:  Chest pain since one day  HPI:  58 year old female with history of hypertension, diet controlled hyperlipidemia, positive family history of heart disease (grandmother died of MI at 70, mother died of MI at 65 and a brother had MI at age of 11 with CABG done ), remote history of smoking who presented to the ED with substernal chest pain while brushing her teeth this morning. Patient reports sharp stabbing pain over her chest 7/10 in intensity which is nonradiating and worsened with deep inspiration and movement. The pain was persistent. He denies any chest trauma or lifting heavy weights. Denies any recent illness. She denies any shortness of breath, palpitations, fever, chills, headache, blurred vision, dizziness, nausea, vomiting, abdominal pain, bowel or urinary symptoms. She reports having been about 2 days back lasting throughout the day .  Course in the ED patient's vitals were stable. Blood work done was unremarkable except for potassium of 2.6. Initial troponin was negative. EKG showed normal sinus rhythm with T-wave inversion in V2 and V3 with no old EKG to compare. Chest x-ray was unremarkable. Given her strong family history and heart score of 4 hospitalists consulted to admit patient under observation to rule out for ACS.  Review of Systems:  Constitutional: Denies fever, chills, diaphoresis, appetite change and fatigue.  HEENT: Denies ear pain, congestion, sore throat, neck pain, Respiratory: Denies SOB, DOE, cough, chest tightness,  and wheezing.   Cardiovascular:  chest pain, denies palpitations and leg swelling.  Gastrointestinal: Denies nausea, vomiting, abdominal pain, diarrhea, constipation, blood in stool and abdominal distention.  Genitourinary: Denies dysuria, urgency, frequency, hematuria,  flank pain and difficulty urinating.  Endocrine: Denies: hot or cold intolerance,  polyuria, polydipsia. Musculoskeletal: Denies myalgias, back pain, joint swelling, arthralgias   Neurological: Denies dizziness, seizures, syncope, weakness, light-headedness, numbness and headaches.     Past Medical History  Diagnosis Date  . Trichomonas 04/19/2009  . Adenomatous polyp of colon 2011   Past Surgical History  Procedure Laterality Date  . Abdominal hysterectomy  1991   Social History:  reports that she quit smoking about 36 years ago. She has never used smokeless tobacco. She reports that she drinks alcohol. She reports that she does not use illicit drugs.  Allergies  Allergen Reactions  . Latex Rash    Family History  Problem Relation Age of Onset  . Hypertension Mother   . Heart disease Mother   . Cancer Paternal Grandmother     leukemia  . Cancer Paternal Grandfather     lung    Prior to Admission medications   Medication Sig Start Date End Date Taking? Authorizing Provider  calcium carbonate (OS-CAL) 600 MG TABS Take 600 mg by mouth 2 (two) times daily with a meal.     Yes Historical Provider, MD  docusate sodium (COLACE) 100 MG capsule Take 100 mg by mouth 2 (two) times daily.   Yes Historical Provider, MD  hydrochlorothiazide (MICROZIDE) 12.5 MG capsule Take 1 capsule (12.5 mg total) by mouth daily. Take one every morning 02/26/13  Yes Terrance Mass, MD  NONFORMULARY OR COMPOUNDED ITEM Estradiol .02% 1 ML Prefilled Applicator Sig: apply vaginally twice a week #90 Day Supply with 4 refills 02/26/13  Yes Terrance Mass, MD  vitamin E 600 UNIT capsule Take 600 Units by mouth daily.     Yes Historical Provider, MD  Physical Exam:  Filed Vitals:   09/25/13 1000 09/25/13 1206 09/25/13 1302  BP: 170/82 129/75 119/62  Pulse: 82 62 65  Temp: 97.4 F (36.3 C)  98.4 F (36.9 C)  TempSrc: Oral  Oral  Resp: 19 17 15   SpO2: 100% 100% 100%    Constitutional: Vital  signs reviewed. Middle aged female in no acute distress. HEENT: no pallor, no icterus, moist oral mucosa, no cervical lymphadenopathy Cardiovascular: RRR, S1 normal, S2 normal, no MRG, reproducible pain palpation over her mid sternum Chest: CTAB, no wheezes, rales, or rhonchi Abdominal: Soft. Non-tender, non-distended, bowel sounds are normal,  Ext: warm, no edema Neurological: Alert and oriented  Labs on Admission:  Basic Metabolic Panel:  Recent Labs Lab 09/25/13 1022  NA 143  K 3.6*  CL 102  CO2 28  GLUCOSE 104*  BUN 14  CREATININE 0.76  CALCIUM 9.6   Liver Function Tests:  Recent Labs Lab 09/25/13 1022  AST 18  ALT 15  ALKPHOS 54  BILITOT 0.4  PROT 7.9  ALBUMIN 4.1   No results found for this basename: LIPASE, AMYLASE,  in the last 168 hours No results found for this basename: AMMONIA,  in the last 168 hours CBC:  Recent Labs Lab 09/25/13 1022  WBC 9.3  NEUTROABS 3.9  HGB 13.0  HCT 37.5  MCV 89.7  PLT 290   Cardiac Enzymes:  Recent Labs Lab 09/25/13 1022  TROPONINI <0.30   BNP: No components found with this basename: POCBNP,  CBG: No results found for this basename: GLUCAP,  in the last 168 hours  Radiological Exams on Admission: Dg Chest 2 View  09/25/2013   CLINICAL DATA:  Left chest discomfort worse with deep inspiration.  EXAM: CHEST  2 VIEW  COMPARISON:  None.  FINDINGS: The heart size and mediastinal contours are within normal limits. Both lungs are clear. No pleural effusion. No pneumothorax. The visualized skeletal structures are unremarkable.  IMPRESSION: Normal chest radiographs   Electronically Signed   By: Lajean Manes M.D.   On: 09/25/2013 10:43    EKG: Normal sinus rhythm with T-wave inversion in V2-V3, no old EKG to compare  Assessment/Plan Principal Problem:   Chest pain Unreturned observation to telemetry. Heart score for major cardiac event is 4 Pain appears atypical and reproducible however given normal triggering  symptoms and underlying history of hypertension, hyperlipidemia and significant family history we'll rule out for ACS with serial cardiac enzymes and EKG. 2D  echo to evaluate LV function and rule out wall motion abnormality. -If  ruled out for ACS and unremarkable echo patient can be discharged home with outpatient referral for stress test.  Active Problems:   Hypercholesterolemia Diet controlled. Check lipid panel in a.m.Marland Kitchen Check A1c      HTN (hypertension) Resume HCTZ.  Hypokalemia Replenished        Diet:cardiac  DVT prophylaxis: sq lovenox   Code Status: full code Family Communication: discussed with patient  at bedside Disposition Plan: home possibly tomorrow if ruled out for ACS.  Louellen Molder [facility] Pager [phone]  Total time spent on admission :50 minutes  If 7PM-7AM, please contact night-coverage [email or web address] Password [email or web address] 09/25/2013, 1:41 PM

## 2013-09-25 NOTE — ED Provider Notes (Signed)
CSN: [id number]     Arrival date & time 09/25/13  9326 History   First MD Initiated Contact with Patient 09/25/13 [phone]     Chief Complaint  Patient presents with  . Chest Pain     (Consider location/radiation/quality/duration/timing/severity/associated sxs/prior Treatment) HPI Comments: Patient presents with left-sided chest pain onset about 45 minutes ago while she was brushing her teeth. The pain is constant and does not radiate. It is associated with some shortness of breath. No nausea, dizziness, lightheadedness. No fevers or cough. No history of cardiac problems. She reports having similar pain 10 years ago when she had a negative stress test. 2 days ago she had constant left arm pain that lasted all day and is now resolved. She has a history of former smoker, hypertension, hyperlipidemia. The pain is worse with deep breathing. It is also exertional.  The history is provided by the patient.    Past Medical History  Diagnosis Date  . Trichomonas 04/19/2009  . Adenomatous polyp of colon 2011   Past Surgical History  Procedure Laterality Date  . Abdominal hysterectomy  1991   Family History  Problem Relation Age of Onset  . Hypertension Mother   . Heart disease Mother   . Cancer Paternal Grandmother     leukemia  . Cancer Paternal Grandfather     lung   History  Substance Use Topics  . Smoking status: Former Smoker    Quit date: 04/08/1977  . Smokeless tobacco: Never Used  . Alcohol Use: Yes     Comment: beer on saturday...   OB History   Grav Para Term Preterm Abortions TAB SAB Ect Mult Living   2 1 1  1  1   1      Review of Systems  Constitutional: Negative for fever, activity change and appetite change.  HENT: Negative for congestion and rhinorrhea.   Respiratory: Positive for chest tightness and shortness of breath. Negative for cough.   Cardiovascular: Positive for chest pain.  Gastrointestinal: Negative for nausea, vomiting and abdominal pain.   Genitourinary: Negative for dysuria, hematuria, vaginal bleeding and vaginal discharge.  Musculoskeletal: Negative for arthralgias, back pain and myalgias.  Skin: Negative for rash.  Neurological: Negative for dizziness, weakness and headaches.  A complete 10 system review of systems was obtained and all systems are negative except as noted in the HPI and PMH.      Allergies  Latex  Home Medications   Prior to Admission medications   Medication Sig Start Date End Date Taking? Authorizing Provider  calcium carbonate (OS-CAL) 600 MG TABS Take 600 mg by mouth 2 (two) times daily with a meal.     Yes Historical Provider, MD  docusate sodium (COLACE) 100 MG capsule Take 100 mg by mouth 2 (two) times daily.   Yes Historical Provider, MD  hydrochlorothiazide (MICROZIDE) 12.5 MG capsule Take 1 capsule (12.5 mg total) by mouth daily. Take one every morning 02/26/13  Yes Terrance Mass, MD  NONFORMULARY OR COMPOUNDED ITEM Estradiol .02% 1 ML Prefilled Applicator Sig: apply vaginally twice a week #90 Day Supply with 4 refills 02/26/13  Yes Terrance Mass, MD  vitamin E 600 UNIT capsule Take 600 Units by mouth daily.     Yes Historical Provider, MD   BP 148/70  Pulse 63  Temp(Src) 97.5 F (36.4 C) (Oral)  Resp 18  Ht 5\' 5"  (1.651 m)  Wt 185 lb 3 oz (84 kg)  BMI 30.82 kg/m2  SpO2 99%  LMP  01/06/1990 Physical Exam  Nursing note and vitals reviewed. Constitutional: She is oriented to person, place, and time. She appears well-developed and well-nourished. No distress.  HENT:  Head: Normocephalic and atraumatic.  Mouth/Throat: Oropharynx is clear and moist. No oropharyngeal exudate.  Eyes: Conjunctivae and EOM are normal. Pupils are equal, round, and reactive to light.  Neck: Normal range of motion. Neck supple.  No meningismus.  Cardiovascular: Normal rate, regular rhythm, normal heart sounds and intact distal pulses.   No murmur heard. Pulmonary/Chest: Effort normal and breath  sounds normal. No respiratory distress. She exhibits tenderness.  Some tenderness to chest wall  Abdominal: Soft. There is no tenderness. There is no rebound and no guarding.  Musculoskeletal: Normal range of motion. She exhibits no edema and no tenderness.  Neurological: She is alert and oriented to person, place, and time. No cranial nerve deficit. She exhibits normal muscle tone. Coordination normal.  No ataxia on finger to nose bilaterally. No pronator drift. 5/5 strength throughout. CN 2-12 intact. Negative Romberg. Equal grip strength. Sensation intact. Gait is normal.   equal grip strengths, equal pulses  Skin: Skin is warm.  Psychiatric: She has a normal mood and affect. Her behavior is normal.    ED Course  Procedures (including critical care time) Labs Review Labs Reviewed  CBC WITH DIFFERENTIAL - Abnormal; Notable for the following:    Neutrophils Relative % 42 (*)    Lymphocytes Relative 51 (*)    Lymphs Abs 4.7 (*)    All other components within normal limits  COMPREHENSIVE METABOLIC PANEL - Abnormal; Notable for the following:    Potassium 3.6 (*)    Glucose, Bld 104 (*)    All other components within normal limits  TROPONIN I  D-DIMER, QUANTITATIVE  CK  HEMOGLOBIN A1C  TROPONIN I  TROPONIN I    Imaging Review Dg Chest 2 View  09/25/2013   CLINICAL DATA:  Left chest discomfort worse with deep inspiration.  EXAM: CHEST  2 VIEW  COMPARISON:  None.  FINDINGS: The heart size and mediastinal contours are within normal limits. Both lungs are clear. No pleural effusion. No pneumothorax. The visualized skeletal structures are unremarkable.  IMPRESSION: Normal chest radiographs   Electronically Signed   By: Lajean Manes M.D.   On: 09/25/2013 10:43     EKG Interpretation   Date/Time:  Saturday September 25 2013 10:58:46 EDT Ventricular Rate:  62 PR Interval:  161 QRS Duration: 102 QT Interval:  400 QTC Calculation: 406 R Axis:   26 Text Interpretation:  Sinus rhythm  Low voltage, precordial leads RSR' in  V1 or V2, right VCD or RVH Borderline T abnormalities, anterior leads No  significant change was found Confirmed by Wyvonnia Dusky  MD, Vinette Crites [phone]) on  09/25/2013 11:22:48 AM      MDM   Final diagnoses:  Chest pain, unspecified chest pain type   Patient reports left-sided chest pain that onset while brushing her teeth about 45 minutes ago. It is worse with deep breathing. No known cardiac history. EKG has artifact but no acute evidence of ischemia.  Troponin negative. D-dimer negative. Chest x-ray with no acute change.  Pain is reproducible on exam. However patient had episode of left arm pain 2 days ago it was constant Her heart score is 4. Pain has some typical features as well and some exertional component.   Will admit for rule out given risk factors and strong family history.    Ezequiel Essex, MD 09/25/13 [phone]

## 2013-09-25 NOTE — Plan of Care (Signed)
Problem: Consults Goal: Chest Pain Patient Education (See Patient Education module for education specifics.) Outcome: Completed/Met Date Met:  09/25/13 Cardiac history in the. discussed the importance of getting medical treatment when experiencing chest pain

## 2013-09-25 NOTE — ED Notes (Signed)
She c/o left chest discomfort worse with deep breath x ~ 45 min. P.t.a.  Her skin is normal, warm and dry and she is moderately short of breath.

## 2013-09-26 DIAGNOSIS — R079 Chest pain, unspecified: Secondary | ICD-10-CM

## 2013-09-26 LAB — TROPONIN I
Troponin I: <0.3 ng/mL (ref <0.30)
Troponin I: <0.3 ng/mL (ref <0.30)

## 2013-09-26 LAB — LIPID PANEL
Cholesterol: 199 mg/dL (ref 0–200)
HDL: 55 mg/dL (ref >39)
LDL Cholesterol: 115 mg/dL — ABNORMAL HIGH (ref 0–99)
Total CHOL/HDL Ratio: 3.6 RATIO
Triglycerides: 143 mg/dL (ref <150)
VLDL: 29 mg/dL (ref 0–40)

## 2013-09-26 MED ORDER — IBUPROFEN 600 MG PO TABS: 600.0000 mg | ORAL_TABLET | Freq: Four times a day (QID) | ORAL | Status: DC | PRN

## 2013-09-26 NOTE — Progress Notes (Signed)
Echocardiogram 2D Echocardiogram has been performed.  Belinda Fowler 09/26/2013, 10:19 AM

## 2013-09-26 NOTE — Discharge Instructions (Signed)
Chest Pain Observation  It is often hard to give a specific diagnosis for the cause of chest pain. Among other possibilities your symptoms might be caused by inadequate oxygen delivery to your heart (angina). Angina that is not treated or evaluated can lead to a heart attack (myocardial infarction) or death.  Blood tests, electrocardiograms, and X-rays may have been done to help determine a possible cause of your chest pain. After evaluation and observation, your health care provider has determined that it is unlikely your pain was caused by an unstable condition that requires hospitalization. However, a full evaluation of your pain may need to be completed, with additional diagnostic testing as directed. It is very important to keep your follow-up appointments. Not keeping your follow-up appointments could result in permanent heart damage, disability, or death. If there is any problem keeping your follow-up appointments, you must call your health care provider.  HOME CARE INSTRUCTIONS   Due to the slight chance that your pain could be angina, it is important to follow your health care provider's treatment plan and also maintain a healthy lifestyle:  · Maintain or work toward achieving a healthy weight.  · Stay physically active and exercise regularly.  · Decrease your salt intake.  · Eat a balanced, healthy diet. Talk to a dietitian to learn about heart-healthy foods.  · Increase your fiber intake by including whole grains, vegetables, fruits, and nuts in your diet.  · Avoid situations that cause stress, anger, or depression.  · Take medicines as advised by your health care provider. Report any side effects to your health care provider. Do not stop medicines or adjust the dosages on your own.  · Quit smoking. Do not use nicotine patches or gum until you check with your health care provider.  · Keep your blood pressure, blood sugar, and cholesterol levels within normal limits.  · Limit alcohol intake to no more than  1 drink per day for women who are not pregnant and 2 drinks per day for men.  · Do not abuse drugs.  SEEK IMMEDIATE MEDICAL CARE IF:  You have severe chest pain or pressure which may include symptoms such as:  · You feel pain or pressure in your arms, neck, jaw, or back.  · You have severe back or abdominal pain, feel sick to your stomach (nauseous), or throw up (vomit).  · You are sweating profusely.  · You are having a fast or irregular heartbeat.  · You feel short of breath while at rest.  · You notice increasing shortness of breath during rest, sleep, or with activity.  · You have chest pain that does not get better after rest or after taking your usual medicine.  · You wake from sleep with chest pain.  · You are unable to sleep because you cannot breathe.  · You develop a frequent cough or you are coughing up blood.  · You feel dizzy, faint, or experience extreme fatigue.  · You develop severe weakness, dizziness, fainting, or chills.  Any of these symptoms may represent a serious problem that is an emergency. Do not wait to see if the symptoms will go away. Call your local emergency services (911 in the [place] Do not drive yourself to the hospital.  MAKE SURE YOU:  · Understand these instructions.  · Will watch your condition.  · Will get help right away if you are not doing well or get worse.  Document Released: 03/02/2010 Document Revised: 02/02/2013 Document Reviewed: 07/30/2012    ExitCare® Patient Information ©2015 [employer]. This information is not intended to replace advice given to you by your health care provider. Make sure you discuss any questions you have with your health care provider.

## 2013-09-26 NOTE — Discharge Summary (Signed)
Physician Discharge Summary  Belinda Fowler MWU:[id number] DOB: 08-08-1955 DOA: 09/25/2013  PCP: Donnie Coffin, MD  Admit date: 09/25/2013 Discharge date: 09/26/2013  Time spent: 25 minutes  Recommendations for Outpatient Follow-up:  1. D/c home with oupt cardiology referral for stress test.  Discharge Diagnoses:  Principal Problem:   Chest pain, musculoskeletal  Active Problems:   Hypercholesterolemia   Overweight(278.02)   HTN (hypertension)   Discharge Condition: fair  Diet recommendation: low sodium, low cholesterol  Filed Weights   09/25/13 1206 09/25/13 1411  Weight: 84 kg (185 lb 3 oz) 84 kg (185 lb 3 oz)    History of present illness:  Please refer to admission H&P for details, but in brief, 58 year old female with history of hypertension, diet controlled hyperlipidemia, positive family history of heart disease (grandmother died of MI at 79, mother died of MI at 11 and a brother had MI at age of 57 with CABG done ), remote history of smoking who presented to the ED with substernal chest pain while brushing her teeth this morning. Patient reports sharp stabbing pain over her chest 7/10 in intensity which is nonradiating and worsened with deep inspiration and movement. The pain was persistent. He denies any chest trauma or lifting heavy weights. Denies any recent illness. She denies any shortness of breath, palpitations, fever, chills, headache, blurred vision, dizziness, nausea, vomiting, abdominal pain, bowel or urinary symptoms. She reports having been about 2 days back lasting throughout the day .  Course in the ED  patient's vitals were stable. Blood work done was unremarkable except for potassium of 3.6. Initial troponin was negative. EKG showed normal sinus rhythm with T-wave inversion in V2 and V3 with no old EKG to compare. Chest x-ray was unremarkable. Given her strong family history and heart score of 4 hospitalists consulted to admit patient under observation  to rule out for ACS.   Hospital Course:  Chest pain   Heart score for major cardiac event is 4  Pain appears atypical and reproducible however given normal triggering symptoms and underlying history of hypertension, hyperlipidemia and significant family history admitted to  out for ACS with serial cardiac enzymes and EKG which were negative. 2D echo done shows normal  LV function and no wall motion abnormality.  Chest pain has markedly improved and stable on teleemtry. - patient can be discharged home with outpatient referral for stress test.   Active Problems:  Hypercholesterolemia  Diet controlled. Check lipid panel shows LDL of 115. counseled on diet and  exercise for  weight reduction.  A1c normal.  HTN (hypertension)  Resume HCTZ.   Hypokalemia  Replenished  Discharge Exam: Filed Vitals:   09/26/13 0451  BP: 142/76  Pulse: 53  Temp: 97.8 F (36.6 C)  Resp: 18   HEENT: no pallor, no icterus, moist oral mucosa,  Cardiovascular: RRR, S1 normal, S2 normal, no MRG, mild  reproducible pain palpation over her mid sternum  Chest: CTAB,  Abdominal: Soft. Non-tender, non-distended, bowel sounds are normal,  Ext: warm, no edema Neurological: Alert and oriented   Discharge Instructions You were cared for by a hospitalist during your hospital stay. If you have any questions about your discharge medications or the care you received while you were in the hospital after you are discharged, you can call the unit and asked to speak with the hospitalist on call if the hospitalist that took care of you is not available. Once you are discharged, your primary care physician will handle any further medical issues. Please  note that NO REFILLS for any discharge medications will be authorized once you are discharged, as it is imperative that you return to your primary care physician (or establish a relationship with a primary care physician if you do not have one) for your aftercare needs so that  they can reassess your need for medications and monitor your lab values.     Medication List         calcium carbonate 600 MG Tabs tablet  Commonly known as:  OS-CAL  Take 600 mg by mouth 2 (two) times daily with a meal.     docusate sodium 100 MG capsule  Commonly known as:  COLACE  Take 100 mg by mouth 2 (two) times daily.     hydrochlorothiazide 12.5 MG capsule  Commonly known as:  MICROZIDE  Take 1 capsule (12.5 mg total) by mouth daily. Take one every morning     ibuprofen 600 MG tablet  Commonly known as:  ADVIL,MOTRIN  Take 1 tablet (600 mg total) by mouth every 6 (six) hours as needed for mild pain.     NONFORMULARY OR COMPOUNDED ITEM  - Estradiol .02%  - 1 ML Prefilled Applicator  - Sig: apply vaginally twice a week  - #90 Day Supply with 4 refills     vitamin E 600 UNIT capsule  Take 600 Units by mouth daily.       Allergies  Allergen Reactions  . Latex Rash       Follow-up Information   Follow up with Donnie Coffin, MD. Schedule an appointment as soon as possible for a visit in 1 week. (please refer for outpt stress test)    Specialty:  Family Medicine   Contact information:   [address] [place] [address] [phone]        The results of significant diagnostics from this hospitalization (including imaging, microbiology, ancillary and laboratory) are listed below for reference.    Significant Diagnostic Studies: Dg Chest 2 View  09/25/2013   CLINICAL DATA:  Left chest discomfort worse with deep inspiration.  EXAM: CHEST  2 VIEW  COMPARISON:  None.  FINDINGS: The heart size and mediastinal contours are within normal limits. Both lungs are clear. No pleural effusion. No pneumothorax. The visualized skeletal structures are unremarkable.  IMPRESSION: Normal chest radiographs   Electronically Signed   By: Lajean Manes M.D.   On: 09/25/2013 10:43    Microbiology: No results found for this or any previous visit (from the past  240 hour(s)).   Labs: Basic Metabolic Panel:  Recent Labs Lab 09/25/13 1022  NA 143  K 3.6*  CL 102  CO2 28  GLUCOSE 104*  BUN 14  CREATININE 0.76  CALCIUM 9.6   Liver Function Tests:  Recent Labs Lab 09/25/13 1022  AST 18  ALT 15  ALKPHOS 54  BILITOT 0.4  PROT 7.9  ALBUMIN 4.1   No results found for this basename: LIPASE, AMYLASE,  in the last 168 hours No results found for this basename: AMMONIA,  in the last 168 hours CBC:  Recent Labs Lab 09/25/13 1022  WBC 9.3  NEUTROABS 3.9  HGB 13.0  HCT 37.5  MCV 89.7  PLT 290   Cardiac Enzymes:  Recent Labs Lab 09/25/13 1022 09/25/13 1817 09/26/13 0002 09/26/13 0535  CKTOTAL 137  --   --   --   TROPONINI <0.30 <0.30 <0.30 <0.30   BNP: BNP (last 3 results) No results found for this basename: PROBNP,  in the last 8760 hours CBG: No results found for this basename: GLUCAP,  in the last 168 hours     Signed:  Louellen Molder  [facility] 09/26/2013, 12:55 PM

## 2013-10-06 ENCOUNTER — Ambulatory Visit (HOSPITAL_COMMUNITY): Payer: 59 | Attending: Cardiovascular Disease | Admitting: Radiology

## 2013-10-06 VITALS — BP 125/72 | Ht 65.0 in | Wt 183.0 lb

## 2013-10-06 DIAGNOSIS — R079 Chest pain, unspecified: Secondary | ICD-10-CM | POA: Diagnosis present

## 2013-10-06 DIAGNOSIS — I10 Essential (primary) hypertension: Secondary | ICD-10-CM | POA: Insufficient documentation

## 2013-10-06 DIAGNOSIS — R42 Dizziness and giddiness: Secondary | ICD-10-CM | POA: Insufficient documentation

## 2013-10-06 DIAGNOSIS — R0602 Shortness of breath: Secondary | ICD-10-CM | POA: Insufficient documentation

## 2013-10-06 DIAGNOSIS — Z8249 Family history of ischemic heart disease and other diseases of the circulatory system: Secondary | ICD-10-CM | POA: Insufficient documentation

## 2013-10-06 DIAGNOSIS — Z87891 Personal history of nicotine dependence: Secondary | ICD-10-CM | POA: Insufficient documentation

## 2013-10-06 MED ORDER — TECHNETIUM TC 99M SESTAMIBI GENERIC - CARDIOLITE
33.0000 | Freq: Once | INTRAVENOUS | Status: AC | PRN
Start: 2013-10-06 — End: 2013-10-06
  Administered 2013-10-06: 33 via INTRAVENOUS

## 2013-10-06 MED ORDER — TECHNETIUM TC 99M SESTAMIBI GENERIC - CARDIOLITE
11.0000 | Freq: Once | INTRAVENOUS | Status: AC | PRN
  Administered 2013-10-06: 11 via INTRAVENOUS

## 2013-10-06 NOTE — Progress Notes (Signed)
[facility] [address] [place], [place] [address] [phone]    Cardiology Nuclear Med Study  Belinda Fowler is a 58 y.o. female     MRN : [id number]     DOB: [birth year]  Procedure Date: 10/06/2013  Nuclear Med Background Indication for Stress Test:  Evaluation for Ischemia, and Patient seen in hospital on 09-26-2013 for Chest Pain, Enzymes negative History:  No H/O CAD 09/26/13 TEE 10 yrs ago  Cardiac Risk Factors: Family History - CAD, History of Smoking, Hypertension and Lipids  Symptoms:  Chest Pain, Dizziness and SOB   Nuclear Pre-Procedure Caffeine/Decaff Intake:  None> 12 hrs NPO After: 8:30pm   Lungs:  clear O2 Sat: 98% on room air. IV 0.9% NS with Angio Cath:  22g  IV Site: R Wrist x 1, tolerated well IV Started by:  Irven Baltimore, RN  Chest Size (in):  38 Cup Size: D  Height: 5\' 5"  (1.651 m)  Weight:  183 lb (83.008 kg)  BMI:  Body mass index is 30.45 kg/(m^2). Tech Comments:  Patient took Hctz this am per patient.Irven Baltimore, RN.    Nuclear Med Study 1 or 2 day study: 1 day  Stress Test Type:  Stress  Reading MD: N/A  Order Authorizing Provider:  Donnie Coffin, MD  Resting Radionuclide: Technetium 32m Sestamibi  Resting Radionuclide Dose: 11.0 mCi   Stress Radionuclide:  Technetium 11m Sestamibi  Stress Radionuclide Dose: 33.0 mCi           Stress Protocol Rest HR: 59 Stress HR: 153  Rest BP: 125//72 Stress BP: 178/70  Exercise Time (min): 8:00 METS: 10.10   Predicted Max HR: 165 bpm % Max HR: 92.73 bpm Rate Pressure Product: 27234   Dose of Adenosine (mg):  n/a Dose of Lexiscan: n/a mg  Dose of Atropine (mg): n/a Dose of Dobutamine: n/a mcg/kg/min (at max HR)  Stress Test Technologist: Perrin Maltese, EMT-P  Nuclear Technologist:  Vedia Pereyra, CNMT     Rest Procedure:  Myocardial perfusion imaging was performed at rest 45 minutes following the intravenous administration of Technetium 77m Sestamibi. Rest  ECG: NSR - Normal EKG  Stress Procedure:  The patient exercised on the treadmill utilizing the Bruce Protocol for 8:00 minutes. The patient stopped due to fatigue and denied any chest pain.  Technetium 77m Sestamibi was injected at peak exercise and myocardial perfusion imaging was performed after a brief delay. Stress ECG: Insignificant upsloping ST segment depression.  QPS Raw Data Images:  Normal; no motion artifact; normal heart/lung ratio. Stress Images:  Small, mild mid anterior perfusion defect. Rest Images:  Small, mild mid anterior perfusion defect. Subtraction (SDS):  Fixed small, mild anterior perfusion defect. Transient Ischemic Dilatation (Normal <1.22):  0.87 Lung/Heart Ratio (Normal <0.45):  0.36  Quantitative Gated Spect Images QGS EDV:  67 ml QGS ESV:  25 ml  Impression Exercise Capacity:  Good exercise capacity. BP Response:  Normal blood pressure response. Clinical Symptoms:  Fatigue, no chest pain. ECG Impression:  Insignificant upsloping ST segment depression. Comparison with Prior Nuclear Study: No images to compare  Overall Impression:  Low risk stress nuclear study with a small, mild fixed mid anterior perfusion defect.  Given normal wall motion, I suspect that this is breast attenuation.  No ischemia.  .  LV Ejection Fraction: 62%.  LV Wall Motion:  NL LV Function; NL Wall Motion  [name] 10/06/2013

## 2013-12-13 ENCOUNTER — Encounter (HOSPITAL_COMMUNITY): Payer: Self-pay | Admitting: Emergency Medicine

## 2014-02-21 ENCOUNTER — Other Ambulatory Visit: Payer: Self-pay

## 2014-02-21 DIAGNOSIS — Z1231 Encounter for screening mammogram for malignant neoplasm of breast: Secondary | ICD-10-CM

## 2014-02-24 ENCOUNTER — Ambulatory Visit
Admission: RE | Admit: 2014-02-24 | Discharge: 2014-02-24 | Disposition: A | Discharge status: Home / Self Care | Payer: 59 | Source: Ambulatory Visit

## 2014-02-24 DIAGNOSIS — Z1231 Encounter for screening mammogram for malignant neoplasm of breast: Secondary | ICD-10-CM

## 2014-03-25 ENCOUNTER — Telehealth: Payer: Self-pay | Admitting: *Deleted

## 2014-03-25 ENCOUNTER — Encounter: Payer: Self-pay | Admitting: Gynecology

## 2014-03-25 ENCOUNTER — Ambulatory Visit (INDEPENDENT_AMBULATORY_CARE_PROVIDER_SITE_OTHER): Payer: 59 | Admitting: Gynecology

## 2014-03-25 VITALS — BP 128/76 | Ht 65.5 in | Wt 183.0 lb

## 2014-03-25 DIAGNOSIS — N8320 Unspecified ovarian cysts: Secondary | ICD-10-CM

## 2014-03-25 DIAGNOSIS — Z01419 Encounter for gynecological examination (general) (routine) without abnormal findings: Secondary | ICD-10-CM

## 2014-03-25 DIAGNOSIS — N952 Postmenopausal atrophic vaginitis: Secondary | ICD-10-CM

## 2014-03-25 DIAGNOSIS — N83202 Unspecified ovarian cyst, left side: Secondary | ICD-10-CM

## 2014-03-25 MED ORDER — NONFORMULARY OR COMPOUNDED ITEM: Status: DC

## 2014-03-25 NOTE — Progress Notes (Signed)
Belinda Fowler [birth year] [id number]   History:    59 y.o.  for annual gyn exam with no complaints. Patient has been followed by Dr. Alroy Dust her primary care physician who is been doing her blood work. Her flu vaccine is up-to-date. She continues to a healthy and exercise. She was weighing 186 pounds last year is down to 183 pounds today.Review of patient's records indicated that she has a history of colonic polyps removed in 2011. She is currently doing well on estradiol 0.02% vaginal cream twice a week for vaginal atrophy and now has no weighs a motor symptoms.Patient stated that before she began seeing me back in 1981 she had cryo-of her cervix for mild dysplasia but subsequent Pap smears have been normal. Patient has a history of total abdominal hysterectomy for fibroids several years ago along with right salpingo-oophorectomy.  Patient had an ultrasound 01/23/2002 the following was noted: vaginal cuff and right adnexa are normal. Absence of right ovary. 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  Normal bone density 2015  Past medical history,surgical history, family history and social history were all reviewed and documented in the EPIC chart.  Gynecologic History Patient's last menstrual period was 01/06/1990. Contraception: status post hysterectomy Last Pap: 2012. Results were: normal Last mammogram: 2016. Results were: normal  Obstetric History OB History  Gravida Para Term Preterm AB SAB TAB Ectopic Multiple Living  2 1 1  1 1    1     # Outcome Date GA Lbr Len/2nd Weight Sex Delivery Anes PTL Lv  2 SAB           1 Term     F Vag-Spont  N Y       ROS: A ROS was performed and pertinent positives and negatives are included in the history.  GENERAL: No fevers or chills. HEENT: No change in vision, no earache, sore throat or sinus congestion. NECK: No pain or stiffness. CARDIOVASCULAR: No chest pain or  pressure. No palpitations. PULMONARY: No shortness of breath, cough or wheeze. GASTROINTESTINAL: No abdominal pain, nausea, vomiting or diarrhea, melena or bright red blood per rectum. GENITOURINARY: No urinary frequency, urgency, hesitancy or dysuria. MUSCULOSKELETAL: No joint or muscle pain, no back pain, no recent trauma. DERMATOLOGIC: No rash, no itching, no lesions. ENDOCRINE: No polyuria, polydipsia, no heat or cold intolerance. No recent change in weight. HEMATOLOGICAL: No anemia or easy bruising or bleeding. NEUROLOGIC: No headache, seizures, numbness, tingling or weakness. PSYCHIATRIC: No depression, no loss of interest in normal activity or change in sleep pattern.     Exam: chaperone present  BP 128/76 mmHg  Ht 5' 5.5" (1.664 m)  Wt 183 lb (83.008 kg)  BMI 29.98 kg/m2  LMP 01/06/1990  Body mass index is 29.98 kg/(m^2).  General appearance : Well developed well nourished female. No acute distress HEENT: Neck supple, trachea midline, no carotid bruits, no thyroidmegaly Lungs: Clear to auscultation, no rhonchi or wheezes, or rib retractions  Heart: Regular rate and rhythm, no murmurs or gallops Breast:Examined in sitting and supine position were symmetrical in appearance, no palpable masses or tenderness,  no skin retraction, no nipple inversion, no nipple discharge, no skin discoloration, no axillary or supraclavicular lymphadenopathy Abdomen: no palpable masses or tenderness, no rebound or guarding Extremities: no edema or skin discoloration or tenderness  Pelvic:  Bartholin, Urethra, Skene Glands: Within normal limits  Vagina: No gross lesions or discharge  Cervix: Absent  Uterus  absent  Adnexa  Without masses or tenderness  Anus and perineum  normal   Rectovaginal  Refused             Hemoccult patient to schedule colonoscopy in the next few weeks     Assessment/Plan:  59 y.o. female for annual exam to schedule her colonoscopy in the next few weeks. Patient  to return back to the office in 1-2 weeks for follow-up ultrasound with a small calcified area of the left ovary to compare with 2013. Patient was reminded to do her monthly breast exam. We discussed importance of calcium vitamin D and regular exercise for osteoporosis prevention. Patient no longer needs Pap smear according to the new guidelines.   Terrance Mass MD, 10:37 AM 03/25/2014

## 2014-03-25 NOTE — Patient Instructions (Signed)
Colonoscopy  A colonoscopy is an exam to look at the entire large intestine (colon). This exam can help find problems such as tumors, polyps, inflammation, and areas of bleeding. The exam takes about 1 hour.   LET YOUR HEALTH CARE PROVIDER KNOW ABOUT:   · Any allergies you have.  · All medicines you are taking, including vitamins, herbs, eye drops, creams, and over-the-counter medicines.  · Previous problems you or members of your family have had with the use of anesthetics.  · Any blood disorders you have.  · Previous surgeries you have had.  · Medical conditions you have.  RISKS AND COMPLICATIONS   Generally, this is a safe procedure. However, as with any procedure, complications can occur. Possible complications include:  · Bleeding.  · Tearing or rupture of the colon wall.  · Reaction to medicines given during the exam.  · Infection (rare).  BEFORE THE PROCEDURE   · Ask your health care provider about changing or stopping your regular medicines.  · You may be prescribed an oral bowel prep. This involves drinking a large amount of medicated liquid, starting the day before your procedure. The liquid will cause you to have multiple loose stools until your stool is almost clear or light green. This cleans out your colon in preparation for the procedure.  · Do not eat or drink anything else once you have started the bowel prep, unless your health care provider tells you it is safe to do so.  · Arrange for someone to drive you home after the procedure.  PROCEDURE   · You will be given medicine to help you relax (sedative).  · You will lie on your side with your knees bent.  · A long, flexible tube with a light and camera on the end (colonoscope) will be inserted through the rectum and into the colon. The camera sends video back to a computer screen as it moves through the colon. The colonoscope also releases carbon dioxide gas to inflate the colon. This helps your health care provider see the area better.  · During  the exam, your health care provider may take a small tissue sample (biopsy) to be examined under a microscope if any abnormalities are found.  · The exam is finished when the entire colon has been viewed.  AFTER THE PROCEDURE   · Do not drive for 24 hours after the exam.  · You may have a small amount of blood in your stool.  · You may pass moderate amounts of gas and have mild abdominal cramping or bloating. This is caused by the gas used to inflate your colon during the exam.  · Ask when your test results will be ready and how you will get your results. Make sure you get your test results.  Document Released: 01/26/2000 Document Revised: 11/18/2012 Document Reviewed: 10/05/2012  ExitCare® Patient Information ©2015 [employer]. This information is not intended to replace advice given to you by your health care provider. Make sure you discuss any questions you have with your health care provider.

## 2014-03-25 NOTE — Telephone Encounter (Signed)
RX FOR ESTRADIOL 0.02% CREAM WAS NEVER WAS SENT TO PHARMACY , RX WILL BE CALLED INTO [facility].

## 2014-04-14 ENCOUNTER — Ambulatory Visit (INDEPENDENT_AMBULATORY_CARE_PROVIDER_SITE_OTHER): Payer: 59

## 2014-04-14 ENCOUNTER — Other Ambulatory Visit: Payer: Self-pay | Admitting: Gynecology

## 2014-04-14 ENCOUNTER — Ambulatory Visit (INDEPENDENT_AMBULATORY_CARE_PROVIDER_SITE_OTHER): Payer: 59 | Admitting: Gynecology

## 2014-04-14 VITALS — BP 130/80

## 2014-04-14 DIAGNOSIS — N8320 Unspecified ovarian cysts: Secondary | ICD-10-CM | POA: Diagnosis not present

## 2014-04-14 DIAGNOSIS — N839 Noninflammatory disorder of ovary, fallopian tube and broad ligament, unspecified: Secondary | ICD-10-CM

## 2014-04-14 DIAGNOSIS — N83319 Acquired atrophy of ovary, unspecified side: Secondary | ICD-10-CM

## 2014-04-14 DIAGNOSIS — N8331 Acquired atrophy of ovary: Secondary | ICD-10-CM | POA: Diagnosis not present

## 2014-04-14 DIAGNOSIS — Z780 Asymptomatic menopausal state: Secondary | ICD-10-CM

## 2014-04-14 DIAGNOSIS — N838 Other noninflammatory disorders of ovary, fallopian tube and broad ligament: Secondary | ICD-10-CM

## 2014-04-14 DIAGNOSIS — N83202 Unspecified ovarian cyst, left side: Secondary | ICD-10-CM

## 2014-04-14 NOTE — Progress Notes (Addendum)
   Patient presented to the office today for discussion of her ultrasound. Patient been seen in the office for gynecological examination February 10 of this year. Patient with past history of total abdominal hysterectomy for fibroids along with right salpingo-oophorectomy. Review of her records that indicated that an ultrasound that had been done in 2013 demonstrated she had a left calcified ovary that measured 14 x 13 x 14 mm. She denies any symptoms of bloating or any GU or GI complaints.  Ultrasound today demonstrated the following: Absence of the right ovary and uterus. Left ovary calcified solid mass measuring 22 x 22 x 21 mm which is described as  having increased in size from previous ultrasound. It was avascular and arterial blood flow was seen going to the ovary. There was no fluid in the cul-de-sac.  We had a lengthy discussion on ovarian cyst. I raised the concern that it had slightly increased in size for the past 2-1/2 years. Although she was asymptomatic I provided her with several options as follows: Option 1 CA 125 today with follow-up ultrasound in 6 months Option  2 schedule laparoscopic left salpingo-oophorectomy  Patient has decided to proceed with option 1. Limitations of CA 125 were discussed. Risk benefits and pros and cons of waiting versus proceeding with surgery now versus 6 months was also discussed. Patient will be provided with literature information on the CA 125 as well as a laparoscopy and ovarian cyst.  Greater than 50% of the time was spent in counseling and coordination of care with this patient with ovarian cyst.

## 2014-04-14 NOTE — Patient Instructions (Signed)
CA-125 Tumor Marker CA 125 is a tumor marker that is used to help monitor the course of ovarian or endometrial cancer. PREPARATION FOR TEST No preparation is necessary. NORMAL FINDINGS Adults: 0-35 units/mL (0-35 kilounits)/L Ranges for normal findings may vary among different laboratories and hospitals. You should always check with your doctor after having lab work or other tests done to discuss the meaning of your test results and whether your values are considered within normal limits. MEANING OF TEST  Your caregiver will go over the test results with you and discuss the importance and meaning of your results, as well as treatment options and the need for additional tests if necessary. OBTAINING THE TEST RESULTS It is your responsibility to obtain your test results. Ask the lab or department performing the test when and how you will get your results. Document Released: 02/20/2004 Document Revised: 04/22/2011 Document Reviewed: 01/06/2008 [facility] Patient Information 2015 [place], [place]. This information is not intended to replace advice given to you by your health care provider. Make sure you discuss any questions you have with your health care provider. Diagnostic Laparoscopy Laparoscopy is a surgical procedure. It is used to diagnose and treat diseases inside the belly (abdomen). It is usually a brief, common, and relatively simple procedure. The laparoscopeis a thin, lighted, pencil-sized instrument. It is like a telescope. It is inserted into your abdomen through a small cut (incision). Your caregiver can look at the organs inside your body through this instrument. He or she can see if there is anything abnormal. Laparoscopy can be done either in a hospital or outpatient clinic. You may be given a mild sedative to help you relax before the procedure. Once in the operating room, you will be given a drug to make you sleep (general anesthesia). Laparoscopy usually lasts less than 1 hour. After  the procedure, you will be monitored in a recovery area until you are stable and doing well. Once you are home, it will take 2 to 3 days to fully recover. RISKS AND COMPLICATIONS  Laparoscopy has relatively few risks. Your caregiver will discuss the risks with you before the procedure. Some problems that can occur include:  Infection.  Bleeding.  Damage to other organs.  Anesthetic side effects. PROCEDURE Once you receive anesthesia, your surgeon inflates the abdomen with a harmless gas (carbon dioxide). This makes the organs easier to see. The laparoscope is inserted into the abdomen through a small incision. This allows your surgeon to see into the abdomen. Other small instruments are also inserted into the abdomen through other small openings. Many surgeons attach a video camera to the laparoscope to enlarge the view. During a diagnostic laparoscopy, the surgeon may be looking for inflammation, infection, or cancer. Your surgeon may take tissue samples(biopsies). The samples are sent to a specialist in looking at cells and tissue samples (pathologist). The pathologist examines them under a microscope. Biopsies can help to diagnose or confirm a disease. AFTER THE PROCEDURE   The gas is released from inside the abdomen.  The incisions are closed with stitches (sutures). Because these incisions are small (usually less than 1/2 inch), there is usually minimal discomfort after the procedure. There may be some mild discomfort in the throat. This is from the tube placed in the throat while you were sleeping. You may have some mild abdominal discomfort. There may also be discomfort from the instrument placement incisions in the abdomen.  The recovery time is shortened as long as there are no complications.  You  will rest in a recovery room until stable and doing well. As long as there are no complications, you may be allowed to go home. FINDING OUT THE RESULTS OF YOUR TEST Not all test results  are available during your visit. If your test results are not back during the visit, make an appointment with your caregiver to find out the results. Do not assume everything is normal if you have not heard from your caregiver or the medical facility. It is important for you to follow up on all of your test results. HOME CARE INSTRUCTIONS   Take all medicines as directed.  Only take over-the-counter or prescription medicines for pain, discomfort, or fever as directed by your caregiver.  Resume daily activities as directed.  Showers are preferred over baths.  You may resume sexual activities in 1 week or as directed.  Do not drive while taking narcotics. SEEK MEDICAL CARE IF:   There is increasing abdominal pain.  There is new pain in the shoulders (shoulder strap areas).  You feel lightheaded or faint.  You have the chills.  You or your child has an oral temperature above 102 F (38.9 C).  There is pus-like (purulent) drainage from any of the wounds.  You are unable to pass gas or have a bowel movement.  You feel sick to your stomach (nauseous) or throw up (vomit). MAKE SURE YOU:   Understand these instructions.  Will watch your condition.  Will get help right away if you are not doing well or get worse. Document Released: 05/06/2000 Document Revised: 05/25/2012 Document Reviewed: 01/28/2007 [facility] Patient Information 2015 [place], [place]. This information is not intended to replace advice given to you by your health care provider. Make sure you discuss any questions you have with your health care provider. Ovarian Cyst An ovarian cyst is a fluid-filled sac that forms on an ovary. The ovaries are small organs that produce eggs in women. Various types of cysts can form on the ovaries. Most are not cancerous. Many do not cause problems, and they often go away on their own. Some may cause symptoms and require treatment. Common types of ovarian cysts include:  Functional  cysts--These cysts may occur every month during the menstrual cycle. This is normal. The cysts usually go away with the next menstrual cycle if the woman does not get pregnant. Usually, there are no symptoms with a functional cyst.  Endometrioma cysts--These cysts form from the tissue that lines the uterus. They are also called "chocolate cysts" because they become filled with blood that turns brown. This type of cyst can cause pain in the lower abdomen during intercourse and with your menstrual period.  Cystadenoma cysts--This type develops from the cells on the outside of the ovary. These cysts can get very big and cause lower abdomen pain and pain with intercourse. This type of cyst can twist on itself, cut off its blood supply, and cause severe pain. It can also easily rupture and cause a lot of pain.  Dermoid cysts--This type of cyst is sometimes found in both ovaries. These cysts may contain different kinds of body tissue, such as skin, teeth, hair, or cartilage. They usually do not cause symptoms unless they get very big.  Theca lutein cysts--These cysts occur when too much of a certain hormone (human chorionic gonadotropin) is produced and overstimulates the ovaries to produce an egg. This is most common after procedures used to assist with the conception of a baby (in vitro fertilization). CAUSES  Fertility drugs can cause a condition in which multiple large cysts are formed on the ovaries. This is called ovarian hyperstimulation syndrome.  A condition called polycystic ovary syndrome can cause hormonal imbalances that can lead to nonfunctional ovarian cysts. SIGNS AND SYMPTOMS  Many ovarian cysts do not cause symptoms. If symptoms are present, they may include:  Pelvic pain or pressure.  Pain in the lower abdomen.  Pain during sexual intercourse.  Increasing girth (swelling) of the abdomen.  Abnormal menstrual periods.  Increasing pain with menstrual periods.  Stopping having  menstrual periods without being pregnant. DIAGNOSIS  These cysts are commonly found during a routine or annual pelvic exam. Tests may be ordered to find out more about the cyst. These tests may include:  Ultrasound.  X-ray of the pelvis.  CT scan.  MRI.  Blood tests. TREATMENT  Many ovarian cysts go away on their own without treatment. Your health care provider may want to check your cyst regularly for 2-3 months to see if it changes. For women in menopause, it is particularly important to monitor a cyst closely because of the higher rate of ovarian cancer in menopausal women. When treatment is needed, it may include any of the following:  A procedure to drain the cyst (aspiration). This may be done using a long needle and ultrasound. It can also be done through a laparoscopic procedure. This involves using a thin, lighted tube with a tiny camera on the end (laparoscope) inserted through a small incision.  Surgery to remove the whole cyst. This may be done using laparoscopic surgery or an open surgery involving a larger incision in the lower abdomen.  Hormone treatment or birth control pills. These methods are sometimes used to help dissolve a cyst. HOME CARE INSTRUCTIONS   Only take over-the-counter or prescription medicines as directed by your health care provider.  Follow up with your health care provider as directed.  Get regular pelvic exams and Pap tests. SEEK MEDICAL CARE IF:   Your periods are late, irregular, or painful, or they stop.  Your pelvic pain or abdominal pain does not go away.  Your abdomen becomes larger or swollen.  You have pressure on your bladder or trouble emptying your bladder completely.  You have pain during sexual intercourse.  You have feelings of fullness, pressure, or discomfort in your stomach.  You lose weight for no apparent reason.  You feel generally ill.  You become constipated.  You lose your appetite.  You develop acne.  You  have an increase in body and facial hair.  You are gaining weight, without changing your exercise and eating habits.  You think you are pregnant. SEEK IMMEDIATE MEDICAL CARE IF:   You have increasing abdominal pain.  You feel sick to your stomach (nauseous), and you throw up (vomit).  You develop a fever that comes on suddenly.  You have abdominal pain during a bowel movement.  Your menstrual periods become heavier than usual. MAKE SURE YOU:  Understand these instructions.  Will watch your condition.  Will get help right away if you are not doing well or get worse. Document Released: 01/28/2005 Document Revised: 02/02/2013 Document Reviewed: 10/05/2012 [facility] Patient Information 2015 [place], [place]. This information is not intended to replace advice given to you by your health care provider. Make sure you discuss any questions you have with your health care provider.

## 2014-04-15 ENCOUNTER — Telehealth: Payer: Self-pay

## 2014-04-15 LAB — CA 125: CA 125: 1 U/mL (ref <35)

## 2014-04-15 NOTE — Telephone Encounter (Signed)
-----   Message from Terrance Mass, MD sent at 04/15/2014  8:54 AM EST ----- Please inform patient her CA 125 was normal will follow-up with ultrasound in a few months as previously recommended along with a CA 125

## 2014-04-15 NOTE — Telephone Encounter (Signed)
I spoke with patient. She wants to wait until May to schedule surgery. I am holding a date for her until she checks and confirms that it will work with her schedule. She will call me to confirm.

## 2014-04-15 NOTE — Telephone Encounter (Signed)
After having time to consider options you presented her at office visit yesterday she has decided she does want to proceed with surgery. Please send me a surgery order slip. Thanks!

## 2014-05-10 ENCOUNTER — Telehealth: Payer: Self-pay

## 2014-05-10 NOTE — Telephone Encounter (Signed)
I spoke with patient and confirmed that she will have surgery as scheduled on 06/21/14 as previously scheduled. (We did speak on 04/15/14 and she had confirmed she wanted this date.)  She was scheduled for pre op consult with Dr. Moshe Salisbury.  We previously had discussed her financial responsibility to [facility] and a financial letter has been mailed to her.  Benefits sheet was completed and will be scanned into her Media viewing portion of chart.

## 2014-05-11 ENCOUNTER — Encounter: Payer: Self-pay | Admitting: Gynecology

## 2014-05-16 ENCOUNTER — Telehealth: Payer: Self-pay

## 2014-05-16 NOTE — Telephone Encounter (Signed)
We can give her 3 weeks and reassess at the postop visit in 3 weeks and see if she needs additional 3 weeks

## 2014-05-16 NOTE — Telephone Encounter (Signed)
I contacted patient because I received FMLA forms for her.  I advised her I will need signed release form. I will fax that over to her to complete and return.    Patient wants me to fill forms out for her to be out 6 weeks.  I told her typically 2 weeks is about average for this procedure. She said she had this done before and she was out longer that 2 weeks.  I explained that your notes will have to support her absence from work or disability company may not pay her for time out.  She said if she is able to return before 6 weeks that she will but she wants it filled our for six weeks.    Ok with you for me to fill it out that way?

## 2014-05-17 ENCOUNTER — Other Ambulatory Visit: Payer: Self-pay | Admitting: Gynecology

## 2014-05-17 NOTE — Telephone Encounter (Signed)
Left message on patient's cell phone advising. FMLA forms completed and faxed to [employer].

## 2014-05-24 DIAGNOSIS — Z0289 Encounter for other administrative examinations: Secondary | ICD-10-CM

## 2014-06-14 ENCOUNTER — Encounter: Payer: Self-pay | Admitting: Gynecology

## 2014-06-14 ENCOUNTER — Ambulatory Visit (INDEPENDENT_AMBULATORY_CARE_PROVIDER_SITE_OTHER): Payer: 59 | Admitting: Gynecology

## 2014-06-14 VITALS — BP 132/80

## 2014-06-14 DIAGNOSIS — N949 Unspecified condition associated with female genital organs and menstrual cycle: Secondary | ICD-10-CM

## 2014-06-14 DIAGNOSIS — Z01818 Encounter for other preprocedural examination: Secondary | ICD-10-CM

## 2014-06-14 DIAGNOSIS — N9489 Other specified conditions associated with female genital organs and menstrual cycle: Secondary | ICD-10-CM

## 2014-06-14 MED ORDER — OXYCODONE-ACETAMINOPHEN 5-325 MG PO TABS: 1.0000 | ORAL_TABLET | ORAL | Status: DC | PRN

## 2014-06-14 MED ORDER — METOCLOPRAMIDE HCL 10 MG PO TABS: 10.0000 mg | ORAL_TABLET | Freq: Three times a day (TID) | ORAL | Status: DC

## 2014-06-14 NOTE — Progress Notes (Signed)
Belinda Fowler is an 59 y.o. female. For pre-op exam. Patient been seen in the office for gynecological examination February 10 of this year. Patient with past history of total abdominal hysterectomy for fibroids along with right salpingo-oophorectomy. Review of her records that indicated that an ultrasound that had been done in 2013 demonstrated she had a left calcified ovary that measured 14 x 13 x 14 mm. She denies any symptoms of bloating or any GU or GI complaints.  Ultrasound March 3  demonstrated the following: Absence of the right ovary and uterus. Left ovary calcified solid mass measuring 22 x 22 x 21 mm which is described as having increased in size from previous ultrasound. It was avascular and arterial blood flow was seen going to the ovary. There was no fluid in the cul-de-sac.  We had a lengthy discussion on ovarian cyst. I raised the concern that it had slightly increased in size for the past 2-1/2 years. Although she was asymptomatic I provided her with several options as follows: Option 1 CA 125 today with follow-up ultrasound in 6 months Option 2 schedule laparoscopic left salpingo-oophorectomy  Patient has opted for surgical removal. Ca 125 was 1.  Pertinent Gynecological History: Menses: post-menopausal  Bleeding: None Contraception: post menopausal status DES exposure: unknown Blood transfusions: none Sexually transmitted diseases: Trichomoniasis Previous GYN Procedures: One vaginal delivery one elective termination and total abdominal hysterectomy with right salpingo-oophorectomy  Last mammogram: normal Date: 2016 Last pap: normal Date: 2012 OB History: G 2, P 1   Menstrual History: Menarche age: 72 Patient's last menstrual period was 01/06/1990.    Past Medical History  Diagnosis Date  . Trichomonas 04/19/2009  . Adenomatous polyp of colon 2011    Past Surgical History  Procedure Laterality Date  . Abdominal hysterectomy  1991    Family  History  Problem Relation Age of Onset  . Hypertension Mother   . Heart disease Mother   . Cancer Paternal Grandmother     leukemia  . Cancer Paternal Grandfather     lung    Social History:  reports that she quit smoking about 37 years ago. She has never used smokeless tobacco. She reports that she drinks alcohol. She reports that she does not use illicit drugs.  Allergies:  Allergies  Allergen Reactions  . Latex Rash     (Not in a hospital admission)  REVIEW OF SYSTEMS: A ROS was performed and pertinent positives and negatives are included in the history.  GENERAL: No fevers or chills. HEENT: No change in vision, no earache, sore throat or sinus congestion. NECK: No pain or stiffness. CARDIOVASCULAR: No chest pain or pressure. No palpitations. PULMONARY: No shortness of breath, cough or wheeze. GASTROINTESTINAL: No abdominal pain, nausea, vomiting or diarrhea, melena or bright red blood per rectum. GENITOURINARY: No urinary frequency, urgency, hesitancy or dysuria. MUSCULOSKELETAL: No joint or muscle pain, no back pain, no recent trauma. DERMATOLOGIC: No rash, no itching, no lesions. ENDOCRINE: No polyuria, polydipsia, no heat or cold intolerance. No recent change in weight. HEMATOLOGICAL: No anemia or easy bruising or bleeding. NEUROLOGIC: No headache, seizures, numbness, tingling or weakness. PSYCHIATRIC: No depression, no loss of interest in normal activity or change in sleep pattern.     Blood pressure 132/80, last menstrual period 01/06/1990.  Physical Exam:  HEENT:unremarkable Neck:Supple, midline, no thyroid megaly, no carotid bruits Lungs:  Clear to auscultation no rhonchi's or wheezes Heart:Regular rate and rhythm, no murmurs or gallops Breast Exam: Symmetrical in appearance no skin  discoloration or nipple inversion no palpable masses or tenderness and no supraclavicular axillary lymphadenopathy Abdomen: Soft nontender no rebound or guarding Pfannenstiel scar was  noted Pelvic:BUS within normal limits Vagina: Vaginal cuff intact no lesion seen Cervix: Absent Uterus: Absent Adnexa: No palpable masses or tenderness Extremities: No cords, no edema Rectal: Unremarkable    Assessment/Plan: Patient with persistent left calcified ovary has increased in size normal CA 125. Patient would like to proceed with definitive surgery. Patient will be scheduled to undergo laparoscopic left salpingo-oophorectomy next week. The following risk were discussed:                        Patient was counseled as to the risk of surgery to include the following:  1. Infection (prohylactic antibiotics will be administered)  2. DVT/Pulmonary Embolism (prophylactic pneumo compression stockings will be used)  3.Trauma to internal organs requiring additional surgical procedure to repair any injury to     Internal organs requiring perhaps additional hospitalization days.  4.Hemmorhage requiring transfusion and blood products which carry risks such as             anaphylactic reaction, hepatitis and AIDS  Patient had received literature information on the procedure scheduled and all her questions were answered and fully accepts all risk.   [facility] [id number] PMTD@Note :     Terrance Mass 06/14/2014, 3:53 PM  Note: This dictation was prepared with  Dragon/digital dictation along withSmart phrase technology. Any transcriptional errors that result from this process are unintentional.

## 2014-06-14 NOTE — Patient Instructions (Addendum)
   Your procedure is scheduled on: Tuesday, May 10  Enter through the Main Entrance of [facility] at: 6 AM Pick up the phone at the desk and dial [phone] and inform us of your arrival.  Please call this number if you have any problems the morning of surgery: [phone]  Remember: Do not eat or drink after midnight: Monday Take these medicines the morning of surgery with a SIP OF WATER:  HCTZ  Do not wear jewelry, make-up, or FINGER nail polish No metal in your hair or on your body. Do not wear lotions, powders, perfumes.  You may wear deodorant.  Do not bring valuables to the hospital. Contacts, dentures or bridgework may not be worn into surgery.  Patients discharged on the day of surgery will not be allowed to drive home.  Home with husband Coralyn Mark cell [phone] or daughter Charlann Boxer cell [phone]

## 2014-06-14 NOTE — Patient Instructions (Signed)
Ovarian Cyst An ovarian cyst is a fluid-filled sac that forms on an ovary. The ovaries are small organs that produce eggs in women. Various types of cysts can form on the ovaries. Most are not cancerous. Many do not cause problems, and they often go away on their own. Some may cause symptoms and require treatment. Common types of ovarian cysts include:  Functional cysts--These cysts may occur every month during the menstrual cycle. This is normal. The cysts usually go away with the next menstrual cycle if the woman does not get pregnant. Usually, there are no symptoms with a functional cyst.  Endometrioma cysts--These cysts form from the tissue that lines the uterus. They are also called "chocolate cysts" because they become filled with blood that turns brown. This type of cyst can cause pain in the lower abdomen during intercourse and with your menstrual period.  Cystadenoma cysts--This type develops from the cells on the outside of the ovary. These cysts can get very big and cause lower abdomen pain and pain with intercourse. This type of cyst can twist on itself, cut off its blood supply, and cause severe pain. It can also easily rupture and cause a lot of pain.  Dermoid cysts--This type of cyst is sometimes found in both ovaries. These cysts may contain different kinds of body tissue, such as skin, teeth, hair, or cartilage. They usually do not cause symptoms unless they get very big.  Theca lutein cysts--These cysts occur when too much of a certain hormone (human chorionic gonadotropin) is produced and overstimulates the ovaries to produce an egg. This is most common after procedures used to assist with the conception of a baby (in vitro fertilization). CAUSES   Fertility drugs can cause a condition in which multiple large cysts are formed on the ovaries. This is called ovarian hyperstimulation syndrome.  A condition called polycystic ovary syndrome can cause hormonal imbalances that can lead to  nonfunctional ovarian cysts. SIGNS AND SYMPTOMS  Many ovarian cysts do not cause symptoms. If symptoms are present, they may include:  Pelvic pain or pressure.  Pain in the lower abdomen.  Pain during sexual intercourse.  Increasing girth (swelling) of the abdomen.  Abnormal menstrual periods.  Increasing pain with menstrual periods.  Stopping having menstrual periods without being pregnant. DIAGNOSIS  These cysts are commonly found during a routine or annual pelvic exam. Tests may be ordered to find out more about the cyst. These tests may include:  Ultrasound.  X-ray of the pelvis.  CT scan.  MRI.  Blood tests. TREATMENT  Many ovarian cysts go away on their own without treatment. Your health care provider may want to check your cyst regularly for 2-3 months to see if it changes. For women in menopause, it is particularly important to monitor a cyst closely because of the higher rate of ovarian cancer in menopausal women. When treatment is needed, it may include any of the following:  A procedure to drain the cyst (aspiration). This may be done using a long needle and ultrasound. It can also be done through a laparoscopic procedure. This involves using a thin, lighted tube with a tiny camera on the end (laparoscope) inserted through a small incision.  Surgery to remove the whole cyst. This may be done using laparoscopic surgery or an open surgery involving a larger incision in the lower abdomen.  Hormone treatment or birth control pills. These methods are sometimes used to help dissolve a cyst. HOME CARE INSTRUCTIONS   Only take over-the-counter   or prescription medicines as directed by your health care provider.  Follow up with your health care provider as directed.  Get regular pelvic exams and Pap tests. SEEK MEDICAL CARE IF:   Your periods are late, irregular, or painful, or they stop.  Your pelvic pain or abdominal pain does not go away.  Your abdomen becomes  larger or swollen.  You have pressure on your bladder or trouble emptying your bladder completely.  You have pain during sexual intercourse.  You have feelings of fullness, pressure, or discomfort in your stomach.  You lose weight for no apparent reason.  You feel generally ill.  You become constipated.  You lose your appetite.  You develop acne.  You have an increase in body and facial hair.  You are gaining weight, without changing your exercise and eating habits.  You think you are pregnant. SEEK IMMEDIATE MEDICAL CARE IF:   You have increasing abdominal pain.  You feel sick to your stomach (nauseous), and you throw up (vomit).  You develop a fever that comes on suddenly.  You have abdominal pain during a bowel movement.  Your menstrual periods become heavier than usual. MAKE SURE YOU:  Understand these instructions.  Will watch your condition.  Will get help right away if you are not doing well or get worse. Document Released: 01/28/2005 Document Revised: 02/02/2013 Document Reviewed: 10/05/2012 [facility] Patient Information 2015 [place], [place]. This information is not intended to replace advice given to you by your health care provider. Make sure you discuss any questions you have with your health care provider. Diagnostic Laparoscopy Laparoscopy is a surgical procedure. It is used to diagnose and treat diseases inside the belly (abdomen). It is usually a brief, common, and relatively simple procedure. The laparoscopeis a thin, lighted, pencil-sized instrument. It is like a telescope. It is inserted into your abdomen through a small cut (incision). Your caregiver can look at the organs inside your body through this instrument. He or she can see if there is anything abnormal. Laparoscopy can be done either in a hospital or outpatient clinic. You may be given a mild sedative to help you relax before the procedure. Once in the operating room, you will be given a drug to  make you sleep (general anesthesia). Laparoscopy usually lasts less than 1 hour. After the procedure, you will be monitored in a recovery area until you are stable and doing well. Once you are home, it will take 2 to 3 days to fully recover. RISKS AND COMPLICATIONS  Laparoscopy has relatively few risks. Your caregiver will discuss the risks with you before the procedure. Some problems that can occur include:  Infection.  Bleeding.  Damage to other organs.  Anesthetic side effects. PROCEDURE Once you receive anesthesia, your surgeon inflates the abdomen with a harmless gas (carbon dioxide). This makes the organs easier to see. The laparoscope is inserted into the abdomen through a small incision. This allows your surgeon to see into the abdomen. Other small instruments are also inserted into the abdomen through other small openings. Many surgeons attach a video camera to the laparoscope to enlarge the view. During a diagnostic laparoscopy, the surgeon may be looking for inflammation, infection, or cancer. Your surgeon may take tissue samples(biopsies). The samples are sent to a specialist in looking at cells and tissue samples (pathologist). The pathologist examines them under a microscope. Biopsies can help to diagnose or confirm a disease. AFTER THE PROCEDURE   The gas is released from inside the abdomen.  The incisions are closed with stitches (sutures). Because these incisions are small (usually less than 1/2 inch), there is usually minimal discomfort after the procedure. There may be some mild discomfort in the throat. This is from the tube placed in the throat while you were sleeping. You may have some mild abdominal discomfort. There may also be discomfort from the instrument placement incisions in the abdomen.  The recovery time is shortened as long as there are no complications.  You will rest in a recovery room until stable and doing well. As long as there are no complications, you  may be allowed to go home. FINDING OUT THE RESULTS OF YOUR TEST Not all test results are available during your visit. If your test results are not back during the visit, make an appointment with your caregiver to find out the results. Do not assume everything is normal if you have not heard from your caregiver or the medical facility. It is important for you to follow up on all of your test results. HOME CARE INSTRUCTIONS   Take all medicines as directed.  Only take over-the-counter or prescription medicines for pain, discomfort, or fever as directed by your caregiver.  Resume daily activities as directed.  Showers are preferred over baths.  You may resume sexual activities in 1 week or as directed.  Do not drive while taking narcotics. SEEK MEDICAL CARE IF:   There is increasing abdominal pain.  There is new pain in the shoulders (shoulder strap areas).  You feel lightheaded or faint.  You have the chills.  You or your child has an oral temperature above 102 F (38.9 C).  There is pus-like (purulent) drainage from any of the wounds.  You are unable to pass gas or have a bowel movement.  You feel sick to your stomach (nauseous) or throw up (vomit). MAKE SURE YOU:   Understand these instructions.  Will watch your condition.  Will get help right away if you are not doing well or get worse. Document Released: 05/06/2000 Document Revised: 05/25/2012 Document Reviewed: 01/28/2007 [facility] Patient Information 2015 [place], [place]. This information is not intended to replace advice given to you by your health care provider. Make sure you discuss any questions you have with your health care provider.

## 2014-06-16 ENCOUNTER — Encounter (HOSPITAL_COMMUNITY)
Admission: RE | Admit: 2014-06-16 | Discharge: 2014-06-16 | Disposition: A | Discharge status: Home / Self Care | Payer: 59 | Source: Ambulatory Visit | Attending: Gynecology | Admitting: Gynecology

## 2014-06-16 ENCOUNTER — Other Ambulatory Visit: Payer: Self-pay

## 2014-06-16 ENCOUNTER — Encounter (HOSPITAL_COMMUNITY): Payer: Self-pay

## 2014-06-16 DIAGNOSIS — R1909 Other intra-abdominal and pelvic swelling, mass and lump: Secondary | ICD-10-CM | POA: Diagnosis not present

## 2014-06-16 DIAGNOSIS — Z01818 Encounter for other preprocedural examination: Secondary | ICD-10-CM | POA: Diagnosis not present

## 2014-06-16 HISTORY — DX: Unspecified osteoarthritis, unspecified site: M19.90

## 2014-06-16 HISTORY — DX: Essential (primary) hypertension: I10

## 2014-06-16 LAB — BASIC METABOLIC PANEL
Anion gap: 8 (ref 5–15)
BUN: 14 mg/dL (ref 6–20)
CO2: 29 mmol/L (ref 22–32)
Calcium: 9.4 mg/dL (ref 8.9–10.3)
Chloride: 98 mmol/L — ABNORMAL LOW (ref 101–111)
Creatinine, Ser: 0.82 mg/dL (ref 0.44–1.00)
GFR calc Af Amer: >60 mL/min (ref >60)
GFR calc non Af Amer: >60 mL/min (ref >60)
Glucose, Bld: 100 mg/dL — ABNORMAL HIGH (ref 70–99)
Potassium: 3.3 mmol/L — ABNORMAL LOW (ref 3.5–5.1)
Sodium: 135 mmol/L (ref 135–145)

## 2014-06-16 LAB — CBC
HCT: 37.4 % (ref 36.0–46.0)
Hemoglobin: 13.1 g/dL (ref 12.0–15.0)
MCH: 31.6 pg (ref 26.0–34.0)
MCHC: 35 g/dL (ref 30.0–36.0)
MCV: 90.3 fL (ref 78.0–100.0)
Platelets: 266 10*3/uL (ref 150–400)
RBC: 4.14 MIL/uL (ref 3.87–5.11)
RDW: 14.3 % (ref 11.5–15.5)
WBC: 7.1 10*3/uL (ref 4.0–10.5)

## 2014-06-16 LAB — URINALYSIS, ROUTINE W REFLEX MICROSCOPIC
Bilirubin Urine: NEGATIVE
Glucose, UA: NEGATIVE mg/dL
Hgb urine dipstick: NEGATIVE
Ketones, ur: NEGATIVE mg/dL
Leukocytes, UA: NEGATIVE
Nitrite: NEGATIVE
Protein, ur: NEGATIVE mg/dL
Specific Gravity, Urine: <1.005 — ABNORMAL LOW (ref 1.005–1.030)
Urobilinogen, UA: 0.2 mg/dL (ref 0.0–1.0)
pH: 5.5 (ref 5.0–8.0)

## 2014-06-20 ENCOUNTER — Encounter (HOSPITAL_COMMUNITY): Payer: Self-pay | Admitting: Anesthesiology

## 2014-06-20 MED ORDER — DEXTROSE 5 % IV SOLN
2.0000 g | INTRAVENOUS | Status: AC
  Administered 2014-06-21: 2 g via INTRAVENOUS
  Filled 2014-06-20: qty 2

## 2014-06-20 NOTE — Anesthesia Preprocedure Evaluation (Addendum)
Anesthesia Evaluation  Patient identified by MRN, date of birth, ID band Patient awake    Reviewed: Allergy & Precautions, NPO status , Patient's Chart, lab work & pertinent test results  Airway Mallampati: I  TM Distance: >3 FB Neck ROM: Full    Dental no notable dental hx. (+) Teeth Intact   Pulmonary former smoker,  breath sounds clear to auscultation  Pulmonary exam normal       Cardiovascular hypertension, Pt. on medications Normal cardiovascular examRhythm:Regular Rate:Normal     Neuro/Psych negative neurological ROS  negative psych ROS   GI/Hepatic negative GI ROS, Neg liver ROS,   Endo/Other  negative endocrine ROS  Renal/GU negative Renal ROS  negative genitourinary   Musculoskeletal  (+) Arthritis -,   Abdominal (+) - obese,   Peds  Hematology negative hematology ROS (+)   Anesthesia Other Findings   Reproductive/Obstetrics Left Adnexal Mass                            Anesthesia Physical Anesthesia Plan  ASA: II  Anesthesia Plan: General   Post-op Pain Management:    Induction: Intravenous  Airway Management Planned: Oral ETT  Additional Equipment:   Intra-op Plan:   Post-operative Plan: Extubation in OR  Informed Consent: I have reviewed the patients History and Physical, chart, labs and discussed the procedure including the risks, benefits and alternatives for the proposed anesthesia with the patient or authorized representative who has indicated his/her understanding and acceptance.   Dental advisory given  Plan Discussed with: Anesthesiologist, CRNA and Surgeon  Anesthesia Plan Comments:        Anesthesia Quick Evaluation

## 2014-06-21 ENCOUNTER — Encounter (HOSPITAL_COMMUNITY)
Admission: RE | Disposition: A | Discharge status: Home / Self Care | Payer: Self-pay | Source: Ambulatory Visit | Attending: Gynecology

## 2014-06-21 ENCOUNTER — Ambulatory Visit (HOSPITAL_COMMUNITY): Payer: 59 | Admitting: Anesthesiology

## 2014-06-21 ENCOUNTER — Ambulatory Visit (HOSPITAL_COMMUNITY)
Admission: RE | Admit: 2014-06-21 | Discharge: 2014-06-21 | Disposition: A | Discharge status: Home / Self Care | Payer: 59 | Source: Ambulatory Visit | Attending: Gynecology | Admitting: Gynecology

## 2014-06-21 ENCOUNTER — Encounter (HOSPITAL_COMMUNITY): Payer: Self-pay | Admitting: *Deleted

## 2014-06-21 DIAGNOSIS — Z86010 Personal history of colonic polyps: Secondary | ICD-10-CM | POA: Diagnosis not present

## 2014-06-21 DIAGNOSIS — N838 Other noninflammatory disorders of ovary, fallopian tube and broad ligament: Secondary | ICD-10-CM | POA: Insufficient documentation

## 2014-06-21 DIAGNOSIS — Z87891 Personal history of nicotine dependence: Secondary | ICD-10-CM | POA: Diagnosis not present

## 2014-06-21 DIAGNOSIS — Z91040 Latex allergy status: Secondary | ICD-10-CM | POA: Diagnosis not present

## 2014-06-21 DIAGNOSIS — Z90710 Acquired absence of both cervix and uterus: Secondary | ICD-10-CM | POA: Diagnosis not present

## 2014-06-21 DIAGNOSIS — M1990 Unspecified osteoarthritis, unspecified site: Secondary | ICD-10-CM | POA: Insufficient documentation

## 2014-06-21 DIAGNOSIS — N736 Female pelvic peritoneal adhesions (postinfective): Secondary | ICD-10-CM | POA: Insufficient documentation

## 2014-06-21 DIAGNOSIS — N8320 Unspecified ovarian cysts: Secondary | ICD-10-CM | POA: Diagnosis not present

## 2014-06-21 HISTORY — PX: LAPAROSCOPY: SHX197

## 2014-06-21 SURGERY — LAPAROSCOPY, DIAGNOSTIC
Anesthesia: General | Laterality: Left

## 2014-06-21 MED ORDER — NEOSTIGMINE METHYLSULFATE 10 MG/10ML IV SOLN
INTRAVENOUS | Status: AC
  Filled 2014-06-21: qty 1

## 2014-06-21 MED ORDER — NEOSTIGMINE METHYLSULFATE 10 MG/10ML IV SOLN
INTRAVENOUS | Status: DC | PRN
  Administered 2014-06-21: 3 mg via INTRAVENOUS

## 2014-06-21 MED ORDER — FENTANYL CITRATE (PF) 250 MCG/5ML IJ SOLN
INTRAMUSCULAR | Status: AC
  Filled 2014-06-21: qty 5

## 2014-06-21 MED ORDER — FENTANYL CITRATE (PF) 100 MCG/2ML IJ SOLN
INTRAMUSCULAR | Status: DC | PRN
  Administered 2014-06-21 (×6): 50 ug via INTRAVENOUS

## 2014-06-21 MED ORDER — GLYCOPYRROLATE 0.2 MG/ML IJ SOLN
INTRAMUSCULAR | Status: DC | PRN
  Administered 2014-06-21: 0.6 mg via INTRAVENOUS

## 2014-06-21 MED ORDER — LIDOCAINE HCL (CARDIAC) 20 MG/ML IV SOLN
INTRAVENOUS | Status: AC
  Filled 2014-06-21: qty 5

## 2014-06-21 MED ORDER — LIDOCAINE HCL (CARDIAC) 20 MG/ML IV SOLN
INTRAVENOUS | Status: DC | PRN
  Administered 2014-06-21: 80 mg via INTRAVENOUS

## 2014-06-21 MED ORDER — ONDANSETRON HCL 4 MG/2ML IJ SOLN
INTRAMUSCULAR | Status: AC
  Filled 2014-06-21: qty 2

## 2014-06-21 MED ORDER — LACTATED RINGERS IR SOLN
Status: DC | PRN
  Administered 2014-06-21: 3000 mL

## 2014-06-21 MED ORDER — HYDROCODONE-ACETAMINOPHEN 7.5-325 MG PO TABS
ORAL_TABLET | ORAL | Status: AC
  Filled 2014-06-21: qty 1

## 2014-06-21 MED ORDER — MEPERIDINE HCL 25 MG/ML IJ SOLN: 6.2500 mg | INTRAMUSCULAR | Status: DC | PRN

## 2014-06-21 MED ORDER — PROPOFOL 10 MG/ML IV BOLUS
INTRAVENOUS | Status: DC | PRN
  Administered 2014-06-21: 200 mg via INTRAVENOUS

## 2014-06-21 MED ORDER — KETOROLAC TROMETHAMINE 30 MG/ML IJ SOLN
INTRAMUSCULAR | Status: DC | PRN
  Administered 2014-06-21: 30 mg via INTRAVENOUS

## 2014-06-21 MED ORDER — GLYCOPYRROLATE 0.2 MG/ML IJ SOLN
INTRAMUSCULAR | Status: AC
Start: 2014-06-21 — End: 2014-06-21
  Filled 2014-06-21: qty 3

## 2014-06-21 MED ORDER — HYDROCODONE-ACETAMINOPHEN 7.5-325 MG PO TABS
1.0000 | ORAL_TABLET | Freq: Once | ORAL | Status: AC | PRN
  Administered 2014-06-21: 1 via ORAL

## 2014-06-21 MED ORDER — DEXAMETHASONE SODIUM PHOSPHATE 10 MG/ML IJ SOLN
INTRAMUSCULAR | Status: AC
Start: 2014-06-21 — End: 2014-06-21
  Filled 2014-06-21: qty 1

## 2014-06-21 MED ORDER — SCOPOLAMINE 1 MG/3DAYS TD PT72
1.0000 | MEDICATED_PATCH | Freq: Once | TRANSDERMAL | Status: DC
  Administered 2014-06-21: 1.5 mg via TRANSDERMAL

## 2014-06-21 MED ORDER — HYDROMORPHONE HCL 1 MG/ML IJ SOLN: 0.2500 mg | INTRAMUSCULAR | Status: DC | PRN

## 2014-06-21 MED ORDER — METOCLOPRAMIDE HCL 5 MG/ML IJ SOLN: 10.0000 mg | Freq: Once | INTRAMUSCULAR | Status: DC | PRN

## 2014-06-21 MED ORDER — ROCURONIUM BROMIDE 100 MG/10ML IV SOLN
INTRAVENOUS | Status: DC | PRN
  Administered 2014-06-21 (×2): 10 mg via INTRAVENOUS
  Administered 2014-06-21: 40 mg via INTRAVENOUS

## 2014-06-21 MED ORDER — HYDROMORPHONE HCL 1 MG/ML IJ SOLN
INTRAMUSCULAR | Status: DC | PRN
  Administered 2014-06-21 (×2): 0.5 mg via INTRAVENOUS

## 2014-06-21 MED ORDER — HYDROMORPHONE HCL 1 MG/ML IJ SOLN
INTRAMUSCULAR | Status: AC
  Filled 2014-06-21: qty 1

## 2014-06-21 MED ORDER — MIDAZOLAM HCL 2 MG/2ML IJ SOLN
INTRAMUSCULAR | Status: AC
  Filled 2014-06-21: qty 2

## 2014-06-21 MED ORDER — ROCURONIUM BROMIDE 100 MG/10ML IV SOLN
INTRAVENOUS | Status: AC
  Filled 2014-06-21: qty 1

## 2014-06-21 MED ORDER — KETOROLAC TROMETHAMINE 30 MG/ML IJ SOLN
INTRAMUSCULAR | Status: AC
  Filled 2014-06-21: qty 1

## 2014-06-21 MED ORDER — BUPIVACAINE HCL (PF) 0.25 % IJ SOLN
INTRAMUSCULAR | Status: AC
  Filled 2014-06-21: qty 30

## 2014-06-21 MED ORDER — ONDANSETRON HCL 4 MG/2ML IJ SOLN
INTRAMUSCULAR | Status: DC | PRN
  Administered 2014-06-21: 4 mg via INTRAVENOUS

## 2014-06-21 MED ORDER — BUPIVACAINE HCL (PF) 0.25 % IJ SOLN
INTRAMUSCULAR | Status: DC | PRN
  Administered 2014-06-21: 20 mL

## 2014-06-21 MED ORDER — PROPOFOL 10 MG/ML IV BOLUS
INTRAVENOUS | Status: AC
  Filled 2014-06-21: qty 20

## 2014-06-21 MED ORDER — SCOPOLAMINE 1 MG/3DAYS TD PT72
MEDICATED_PATCH | TRANSDERMAL | Status: AC
  Filled 2014-06-21: qty 1

## 2014-06-21 MED ORDER — MIDAZOLAM HCL 2 MG/2ML IJ SOLN
INTRAMUSCULAR | Status: DC | PRN
  Administered 2014-06-21: 1 mg via INTRAVENOUS

## 2014-06-21 MED ORDER — DEXAMETHASONE SODIUM PHOSPHATE 10 MG/ML IJ SOLN
INTRAMUSCULAR | Status: DC | PRN
  Administered 2014-06-21: 5 mg via INTRAVENOUS

## 2014-06-21 MED ORDER — LACTATED RINGERS IV SOLN
INTRAVENOUS | Status: DC
  Administered 2014-06-21 (×2): via INTRAVENOUS

## 2014-06-21 SURGICAL SUPPLY — 28 items
BARRIER ADHS 3X4 INTERCEED (GAUZE/BANDAGES/DRESSINGS) IMPLANT
CABLE HIGH FREQUENCY MONO STRZ (ELECTRODE) IMPLANT
CLOTH BEACON ORANGE TIMEOUT ST (SAFETY) ×3 IMPLANT
COVER MAYO STAND STRL (DRAPES) ×3 IMPLANT
DRSG OPSITE POSTOP 3X4 (GAUZE/BANDAGES/DRESSINGS) ×3 IMPLANT
FILTER SMOKE EVAC LAPAROSHD (FILTER) ×3 IMPLANT
GLOVE BIOGEL PI IND STRL 8 (GLOVE) ×2 IMPLANT
GLOVE BIOGEL PI INDICATOR 8 (GLOVE) ×1
GLOVE ECLIPSE 7.5 STRL STRAW (GLOVE) IMPLANT
GOWN STRL REUS W/TWL LRG LVL3 (GOWN DISPOSABLE) ×6 IMPLANT
LIQUID BAND (GAUZE/BANDAGES/DRESSINGS) IMPLANT
NS IRRIG 1000ML POUR BTL (IV SOLUTION) ×3 IMPLANT
PACK LAPAROSCOPY BASIN (CUSTOM PROCEDURE TRAY) ×3 IMPLANT
POUCH SPECIMEN RETRIEVAL 10MM (ENDOMECHANICALS) IMPLANT
PROTECTOR NERVE ULNAR (MISCELLANEOUS) ×3 IMPLANT
SET IRRIG TUBING LAPAROSCOPIC (IRRIGATION / IRRIGATOR) ×3 IMPLANT
SHEARS HARMONIC ACE PLUS 36CM (ENDOMECHANICALS) ×3 IMPLANT
SLEEVE XCEL OPT CAN 5 100 (ENDOMECHANICALS) ×3 IMPLANT
SOLUTION ELECTROLUBE (MISCELLANEOUS) IMPLANT
SUT VIC AB 3-0 PS2 18 (SUTURE) ×1
SUT VIC AB 3-0 PS2 18XBRD (SUTURE) ×2 IMPLANT
SUT VICRYL 0 UR6 27IN ABS (SUTURE) ×3 IMPLANT
TOWEL OR 17X24 6PK STRL BLUE (TOWEL DISPOSABLE) ×6 IMPLANT
TRAY FOLEY CATH SILVER 14FR (SET/KITS/TRAYS/PACK) ×3 IMPLANT
TROCAR DILATING TIP 12MM 150MM (ENDOMECHANICALS) ×3 IMPLANT
TROCAR XCEL NON-BLD 11X100MML (ENDOMECHANICALS) ×3 IMPLANT
TROCAR XCEL NON-BLD 5MMX100MML (ENDOMECHANICALS) ×3 IMPLANT
WARMER LAPAROSCOPE (MISCELLANEOUS) ×3 IMPLANT

## 2014-06-21 NOTE — H&P (View-Only) (Signed)
Belinda Fowler is an 59 y.o. female. For pre-op exam. Patient been seen in the office for gynecological examination February 10 of this year. Patient with past history of total abdominal hysterectomy for fibroids along with right salpingo-oophorectomy. Review of her records that indicated that an ultrasound that had been done in 2013 demonstrated she had a left calcified ovary that measured 14 x 13 x 14 mm. She denies any symptoms of bloating or any GU or GI complaints.  Ultrasound March 3  demonstrated the following: Absence of the right ovary and uterus. Left ovary calcified solid mass measuring 22 x 22 x 21 mm which is described as having increased in size from previous ultrasound. It was avascular and arterial blood flow was seen going to the ovary. There was no fluid in the cul-de-sac.  We had a lengthy discussion on ovarian cyst. I raised the concern that it had slightly increased in size for the past 2-1/2 years. Although she was asymptomatic I provided her with several options as follows: Option 1 CA 125 today with follow-up ultrasound in 6 months Option 2 schedule laparoscopic left salpingo-oophorectomy  Patient has opted for surgical removal. Ca 125 was 1.  Pertinent Gynecological History: Menses: post-menopausal  Bleeding: None Contraception: post menopausal status DES exposure: unknown Blood transfusions: none Sexually transmitted diseases: Trichomoniasis Previous GYN Procedures: One vaginal delivery one elective termination and total abdominal hysterectomy with right salpingo-oophorectomy  Last mammogram: normal Date: 2016 Last pap: normal Date: 2012 OB History: G 2, P 1   Menstrual History: Menarche age: 48 Patient's last menstrual period was 01/06/1990.    Past Medical History  Diagnosis Date  . Trichomonas 04/19/2009  . Adenomatous polyp of colon 2011    Past Surgical History  Procedure Laterality Date  . Abdominal hysterectomy  1991    Family  History  Problem Relation Age of Onset  . Hypertension Mother   . Heart disease Mother   . Cancer Paternal Grandmother     leukemia  . Cancer Paternal Grandfather     lung    Social History:  reports that she quit smoking about 37 years ago. She has never used smokeless tobacco. She reports that she drinks alcohol. She reports that she does not use illicit drugs.  Allergies:  Allergies  Allergen Reactions  . Latex Rash     (Not in a hospital admission)  REVIEW OF SYSTEMS: A ROS was performed and pertinent positives and negatives are included in the history.  GENERAL: No fevers or chills. HEENT: No change in vision, no earache, sore throat or sinus congestion. NECK: No pain or stiffness. CARDIOVASCULAR: No chest pain or pressure. No palpitations. PULMONARY: No shortness of breath, cough or wheeze. GASTROINTESTINAL: No abdominal pain, nausea, vomiting or diarrhea, melena or bright red blood per rectum. GENITOURINARY: No urinary frequency, urgency, hesitancy or dysuria. MUSCULOSKELETAL: No joint or muscle pain, no back pain, no recent trauma. DERMATOLOGIC: No rash, no itching, no lesions. ENDOCRINE: No polyuria, polydipsia, no heat or cold intolerance. No recent change in weight. HEMATOLOGICAL: No anemia or easy bruising or bleeding. NEUROLOGIC: No headache, seizures, numbness, tingling or weakness. PSYCHIATRIC: No depression, no loss of interest in normal activity or change in sleep pattern.     Blood pressure 132/80, last menstrual period 01/06/1990.  Physical Exam:  HEENT:unremarkable Neck:Supple, midline, no thyroid megaly, no carotid bruits Lungs:  Clear to auscultation no rhonchi's or wheezes Heart:Regular rate and rhythm, no murmurs or gallops Breast Exam: Symmetrical in appearance no skin  discoloration or nipple inversion no palpable masses or tenderness and no supraclavicular axillary lymphadenopathy Abdomen: Soft nontender no rebound or guarding Pfannenstiel scar was  noted Pelvic:BUS within normal limits Vagina: Vaginal cuff intact no lesion seen Cervix: Absent Uterus: Absent Adnexa: No palpable masses or tenderness Extremities: No cords, no edema Rectal: Unremarkable    Assessment/Plan: Patient with persistent left calcified ovary has increased in size normal CA 125. Patient would like to proceed with definitive surgery. Patient will be scheduled to undergo laparoscopic left salpingo-oophorectomy next week. The following risk were discussed:                        Patient was counseled as to the risk of surgery to include the following:  1. Infection (prohylactic antibiotics will be administered)  2. DVT/Pulmonary Embolism (prophylactic pneumo compression stockings will be used)  3.Trauma to internal organs requiring additional surgical procedure to repair any injury to     Internal organs requiring perhaps additional hospitalization days.  4.Hemmorhage requiring transfusion and blood products which carry risks such as             anaphylactic reaction, hepatitis and AIDS  Patient had received literature information on the procedure scheduled and all her questions were answered and fully accepts all risk.   [facility] HMD4:36 PMTD@Note :     Terrance Mass 06/14/2014, 3:53 PM  Note: This dictation was prepared with  Dragon/digital dictation along withSmart phrase technology. Any transcriptional errors that result from this process are unintentional.

## 2014-06-21 NOTE — Interval H&P Note (Signed)
History and Physical Interval Note:  06/21/2014 7:09 AM  Belinda Fowler  has presented today for surgery, with the diagnosis of persistant left adnexal mass  The various methods of treatment have been discussed with the patient and family. After consideration of risks, benefits and other options for treatment, the patient has consented to  Procedure(s): LAPAROSCOPIC SALPINGO OOPHORECTOMY (Left) as a surgical intervention .  The patient's history has been reviewed, patient examined, no change in status, stable for surgery.  I have reviewed the patient's chart and labs.  Questions were answered to the patient's satisfaction.     Terrance Mass

## 2014-06-21 NOTE — Anesthesia Postprocedure Evaluation (Signed)
Anesthesia Post Note  Patient: Belinda Fowler  Procedure(s) Performed: Procedure(s): LAPAROSCOPY DIAGNOSTIC  Anesthesia type: General  Patient location: PACU  Post pain: Pain level controlled  Post assessment: Post-op Vital signs reviewed  Last Vitals:  Filed Vitals:   06/21/14 0934  BP: 154/82  Pulse: 65  Temp:   Resp: 14    Post vital signs: Reviewed  Level of consciousness: sedated  Complications: No apparent anesthesia complications

## 2014-06-21 NOTE — Discharge Instructions (Signed)
Diagnostic Laparoscopy  Laparoscopy is a surgical procedure. It is used to diagnose and treat diseases inside the belly (abdomen). It is usually a brief, common, and relatively simple procedure. The laparoscopeis a thin, lighted, pencil-sized instrument. It is like a telescope. It is inserted into your abdomen through a small cut (incision). Your caregiver can look at the organs inside your body through this instrument. He or she can see if there is anything abnormal.  Laparoscopy can be done either in a hospital or outpatient clinic. You may be given a mild sedative to help you relax before the procedure. Once in the operating room, you will be given a drug to make you sleep (general anesthesia). Laparoscopy usually lasts less than 1 hour. After the procedure, you will be monitored in a recovery area until you are stable and doing well. Once you are home, it will take 2 to 3 days to fully recover.  RISKS AND COMPLICATIONS  Laparoscopy has relatively few risks. Your caregiver will discuss the risks with you before the procedure.  Some problems that can occur include:  Infection.  Bleeding.  Damage to other organs.  Anesthetic side effects. PROCEDURE  Once you receive anesthesia, your surgeon inflates the abdomen with a harmless gas (carbon dioxide). This makes the organs easier to see. The laparoscope is inserted into the abdomen through a small incision. This allows your surgeon to see into the abdomen. Other small instruments are also inserted into the abdomen through other small openings. Many surgeons attach a video camera to the laparoscope to enlarge the view.  During a diagnostic laparoscopy, the surgeon may be looking for inflammation, infection, or cancer. Your surgeon may take tissue samples(biopsies). The samples are sent to a specialist in looking at cells and tissue samples (pathologist). The pathologist examines them under a microscope. Biopsies can help to diagnose or confirm a disease.    AFTER THE PROCEDURE  The gas is released from inside the abdomen.  The incisions are closed with stitches (sutures). Because these incisions are small (usually less than 1/2 inch), there is usually minimal discomfort after the procedure. There may be some mild discomfort in the throat. This is from the tube placed in the throat while you were sleeping. You may have some mild abdominal discomfort. There may also be discomfort from the instrument placement incisions in the abdomen.  The recovery time is shortened as long as there are no complications.  You will rest in a recovery room until stable and doing well. As long as there are no complications, you may be allowed to go home. FINDING OUT THE RESULTS OF YOUR TEST  Not all test results are available during your visit. If your test results are not back during the visit, make an appointment with your caregiver to find out the results. Do not assume everything is normal if you have not heard from your caregiver or the medical facility. It is important for you to follow up on all of your test results.  HOME CARE INSTRUCTIONS  Take all medicines as directed.  Only take over-the-counter or prescription medicines for pain, discomfort, or fever as directed by your caregiver.  Resume daily activities as directed.  Showers are preferred over baths.  You may resume sexual activities in 1 week or as directed.  Do not drive while taking narcotics. SEEK MEDICAL CARE IF:  There is increasing abdominal pain.  There is new pain in the shoulders (shoulder strap areas).  You feel lightheaded or faint.  You have the chills.  You or your child has an oral temperature above 102 F (38.9 C).  There is pus-like (purulent) drainage from any of the wounds.  You are unable to pass gas or have a bowel movement.  You feel sick to your stomach (nauseous) or throw up (vomit). MAKE SURE YOU:  Understand these instructions.  Will watch your condition.  Will get help  right away if you are not doing well or get worse. Document Released: 05/06/2000 Document Revised: 05/25/2012 Document Reviewed: 01/28/2007  [facility] Patient Information 2015 [place], [place]. This information is not intended to replace advice given to you by your health care provider. Make sure you discuss any questions you have with your health care provider.    General Anesthesia  General anesthesia is a sleep-like state of non-feeling produced by medicines (anesthetics). General anesthesia prevents you from being alert and feeling pain during a medical procedure. Your caregiver may recommend general anesthesia if your procedure:  Is long.  Is painful or uncomfortable.  Would be frightening to see or hear.  Requires you to be still.  Affects your breathing.  Causes significant blood loss. LET YOUR CAREGIVER KNOW ABOUT:  Allergies to food or medicine.  Medicines taken, including vitamins, herbs, eyedrops, over-the-counter medicines, and creams.  Use of steroids (by mouth or creams).  Previous problems with anesthetics or numbing medicines, including problems experienced by relatives.  History of bleeding problems or blood clots.  Previous surgeries and types of anesthetics received.  Possibility of pregnancy, if this applies.  Use of cigarettes, alcohol, or illegal drugs.  Any health condition(s), especially diabetes, sleep apnea, and high blood pressure. RISKS AND COMPLICATIONS  General anesthesia rarely causes complications. However, if complications do occur, they can be life threatening. Complications include:  A lung infection.  A stroke.  A heart attack.  Waking up during the procedure. When this occurs, the patient may be unable to move and communicate that he or she is awake. The patient may feel severe pain. Older adults and adults with serious medical problems are more likely to have complications than adults who are young and healthy. Some complications can be prevented by  answering all of your caregiver's questions thoroughly and by following all pre-procedure instructions. It is important to tell your caregiver if any of the pre-procedure instructions, especially those related to diet, were not followed. Any food or liquid in the stomach can cause problems when you are under general anesthesia.  BEFORE THE PROCEDURE  Ask your caregiver if you will have to spend the night at the hospital. If you will not have to spend the night, arrange to have an adult drive you and stay with you for 24 hours.  Follow your caregiver's instructions if you are taking dietary supplements or medicines. Your caregiver may tell you to stop taking them or to reduce your dosage.  Do not smoke for as long as possible before your procedure. If possible, stop smoking 3-6 weeks before the procedure.  Do not take new dietary supplements or medicines within 1 week of your procedure unless your caregiver approves them.  Do not eat within 8 hours of your procedure or as directed by your caregiver. Drink only clear liquids, such as water, black coffee (without milk or cream), and fruit juices (without pulp).  Do not drink within 3 hours of your procedure or as directed by your caregiver.  You may brush your teeth on the morning of the procedure, but make sure to  spit out the toothpaste and water when finished. PROCEDURE  You will receive anesthetics through a mask, through an intravenous (IV) access tube, or through both. A doctor who specializes in anesthesia (anesthesiologist) or a nurse who specializes in anesthesia (nurse anesthetist) or both will stay with you throughout the procedure to make sure you remain unconscious. He or she will also watch your blood pressure, pulse, and oxygen levels to make sure that the anesthetics do not cause any problems. Once you are asleep, a breathing tube or mask may be used to help you breathe.  AFTER THE PROCEDURE  You will wake up after the procedure is complete.  You may be in the room where the procedure was performed or in a recovery area. You may have a sore throat if a breathing tube was used. You may also feel:  Dizzy.  Weak.  Drowsy.  Confused.  Nauseous.  Cold. These are all normal responses and can be expected to last for up to 24 hours after the procedure is complete. A caregiver will tell you when you are ready to go home. This will usually be when you are fully awake and in stable condition.  Document Released: 05/07/2007 Document Revised: 06/14/2013 Document Reviewed: 05/29/2011  [facility] Patient Information 2015 [place], [place]. This information is not intended to replace advice given to you by your health care provider. Make sure you discuss any questions you have with your health care provider.

## 2014-06-21 NOTE — Transfer of Care (Signed)
Immediate Anesthesia Transfer of Care Note  Patient: Belinda Fowler  Procedure(s) Performed: Procedure(s): LAPAROSCOPY DIAGNOSTIC  Patient Location: PACU  Anesthesia Type:General  Level of Consciousness: awake, alert , oriented and patient cooperative  Airway & Oxygen Therapy: Patient Spontanous Breathing and Patient connected to nasal cannula oxygen  Post-op Assessment: Report given to RN and Post -op Vital signs reviewed and stable  Post vital signs: Reviewed and stable  Last Vitals:  Filed Vitals:   06/21/14 0653  BP: 120/69  Pulse: 62  Temp: 36.7 C  Resp: 16    Complications: No apparent anesthesia complications

## 2014-06-21 NOTE — Op Note (Signed)
   Operative Note  06/21/2014  9:07 AM  PATIENT:  Belinda Fowler  59 y.o. female  PRE-OPERATIVE DIAGNOSIS:  persistant left adnexal mass  POST-OPERATIVE DIAGNOSIS:  persistant left adnexal mass, extensive pelvic abdominal adhesions  PROCEDURE:  Procedure(s): Diagnostic laparoscopy LAPAROSCOPY DIAGNOSTIC  SURGEON:  Surgeon(s): Terrance Mass, MD Anastasio Auerbach, MD  ANESTHESIA:   general  FINDINGS: Extensive abdominal pelvic adhesions. Small bowel adhered to lower right abdominal wall. Obliterated cul-de-sac and adhesions. Remaining left tube and ovary not visualized.  DESCRIPTION OF OPERATION:After adequate general endotracheal anesthesia, the patient was placed in dorsal lithotomy position, prepped and draped in the usual manner for a laparoscopic procedure. Patient received her prophylactic antibiotic and had PAS stockings in place as well.  A time out was undertaken for proper identification of the patient and procedure to be performed. A speculum was placed into the vagina. A bimanual exam was performed and then then  a ring forcep with a sponge was placed in the vagina since the patient had a previous hysterectomy.. A foley catheter was inserted to monitor urinary output.  At this time, an infraumbilical  skin incision was made through which a 10/11-mm  Opti-View trocar was introduced through the infraumbilical incision into the abdominal cavity. A pneumoperitoneum was established  With CO2 for approximately 2 1/2 liters. Through the trocar sheath, the laparoscope was inserted and adequate visualization of the pelvic structures was noted. Two  5-mm skin incision were made on the patients right and left lower abdomin under laparoscopic guidance and  two  5-mm trocars were  introduced into the abdominal cavity for instrumentation. A third 5 mm trocar was inserted right paramedian lower abdomen. Evaluation of the pelvis revealed segment of small bowel adhered anteriorly to  previous Pfannenstiel incision site. Completely obliterated cul-de-sac making it an accessible to visualize the left tube and ovary. Anterior abdominal wall adhesions were noted in different areas of the abdomen. It was at this point that it was decided to return back at another time after general surgeon consultation to assist with extensive bowel adhesions before proceeding to remove the left tube and ovary..  The trocar sheaths  were then removed under laparoscopic visualization. The laparoscope was removed. The carbon dioxide was allowed to escape from the abdomen and the infraumbilical trocar sheath was then removed. The skin incisions were closed with 0-vicryl at the subumbilical incision site and the skin as well as the 5 mm ports were reapproximated with Dermabon Glue. 0.25% Marcaine was infiltrated all 3 port sites for postoperative analgesia for a total of 20 cc. Neosporin and small dressings applied. There were no complications. The instrument, sponge, and needle counts were correct. PAtient was extubated and transferred to recovery room. Patient received 30 mg of total IV in route to the recovery room.    ESTIMATED BLOOD LOSS: Minimal   Intake/Output Summary (Last 24 hours) at 06/21/14 7017 Last data filed at 06/21/14 0830  Gross per 24 hour  Intake   1400 ml  Output    160 ml  Net   1240 ml     BLOOD ADMINISTERED:none   LOCAL MEDICATIONS USED:  MARCAINE   0.25% subcutaneous incision ports total 20 cc  SPECIMEN:  Source of Specimen:  None  DISPOSITION OF SPECIMEN:  N/A  COUNTS:  YES  PLAN OF CARE: Transfer to PACU  [facility] HMD9:07 AMTD@

## 2014-06-21 NOTE — Anesthesia Procedure Notes (Signed)
Procedure Name: Intubation Date/Time: 06/21/2014 7:33 AM Performed by: Georgeanne Nim Pre-anesthesia Checklist: Patient identified, Emergency Drugs available, Suction available, Patient being monitored and Timeout performed Patient Re-evaluated:Patient Re-evaluated prior to inductionOxygen Delivery Method: Circle system utilized Preoxygenation: Pre-oxygenation with 100% oxygen Intubation Type: IV induction Ventilation: Mask ventilation without difficulty and Oral airway inserted - appropriate to patient size Laryngoscope Size: Mac and 3 Grade View: Grade II Tube type: Oral Tube size: 7.0 mm Number of attempts: 1 Airway Equipment and Method: Stylet Placement Confirmation: ETT inserted through vocal cords under direct vision,  positive ETCO2,  CO2 detector and breath sounds checked- equal and bilateral Secured at: 22 cm Tube secured with: Tape Dental Injury: Teeth and Oropharynx as per pre-operative assessment

## 2014-06-22 ENCOUNTER — Encounter (HOSPITAL_COMMUNITY): Payer: Self-pay | Admitting: Gynecology

## 2014-07-12 ENCOUNTER — Ambulatory Visit (INDEPENDENT_AMBULATORY_CARE_PROVIDER_SITE_OTHER): Payer: 59 | Admitting: Gynecology

## 2014-07-12 ENCOUNTER — Encounter: Payer: Self-pay | Admitting: Gynecology

## 2014-07-12 VITALS — BP 132/74

## 2014-07-12 DIAGNOSIS — Z09 Encounter for follow-up examination after completed treatment for conditions other than malignant neoplasm: Secondary | ICD-10-CM

## 2014-07-12 NOTE — Progress Notes (Signed)
   Patient is a 59 year old who presented to the office for her 3 week postop visit. Patient on 06/21/2014 underwent diagnostic laparoscopy and due to extensive abdominal pelvic adhesions and obliterated cul-de-sac laparoscopic LSO was not possible. Her history is as follows:  Patient had been seen in the office for gynecological examination February 10 of this year. Patient with past history of total abdominal hysterectomy for fibroids along with right salpingo-oophorectomy. Review of her records that indicated that an ultrasound that had been done in 2013 demonstrated she had a left calcified ovary that measured 14 x 13 x 14 mm. She denies any symptoms of bloating or any GU or GI complaints.  Ultrasound March 3 demonstrated the following: Absence of the right ovary and uterus. Left ovary calcified solid mass measuring 22 x 22 x 21 mm which is described as having increased in size from previous ultrasound. It was avascular and arterial blood flow was seen going to the ovary. There was no fluid in the cul-de-sac.  We had a lengthy discussion on ovarian cyst. I raised the concern that it had slightly increased in size for the past 2-1/2 years. Although she was asymptomatic I provided her with several options as follows: Option 1 CA 125 today with follow-up ultrasound in 6 months Option 2 schedule laparoscopic left salpingo-oophorectomy  Patient has opted for surgical removal. Ca 125 was 1.  She has been totally asymptomatic pre-as well as postop.  Exam: Blood pressure 132/74 Well developed well nourished female in no acute distress Abdomen: Incision ports completely healed. Scalp for subumbilical incision removed. Abdomen was soft nontender no rebound or guarding. Findings from surgery:  Evaluation of the pelvis revealed segment of small bowel adhered anteriorly to previous Pfannenstiel incision site. Completely obliterated cul-de-sac making it an accessible to visualize the left tube and ovary.  Anterior abdominal wall adhesions were noted in different areas of the abdomen.    Spoke with the patient today and given her my concerns that since this was not able to be remove laparoscopically that a general surgery consultation with a need to be required positive placing the patient a bowel prep. We also discussed potential bowel injuries resulting in bowel resection and or diverting colostomy for a small calcified area in the ovary there more than likely it is benign. I've firmly believe that the benefits do not outweigh the risks. I'm going to get a second opinion for the GYN oncologist to splint to the patient as well. Patient fully understands and accepts the decision taken. Otherwise I will need to see her back next year for annual exam or when necessary.

## 2014-07-13 ENCOUNTER — Telehealth: Payer: Self-pay | Admitting: *Deleted

## 2014-07-13 NOTE — Telephone Encounter (Signed)
Pt aware with the below note

## 2014-07-13 NOTE — Telephone Encounter (Signed)
Per [facility] staff message "need for you to make an appointment for this patient with Dr. Denman George GYN oncologist for second opinion for ovarian mass" I left message for oncologist to call with time and date for this

## 2014-07-13 NOTE — Telephone Encounter (Signed)
Appointment on 07/18/14 at 1:15pm with Dr.Rossi, left message for pt to call.

## 2014-07-18 ENCOUNTER — Encounter: Payer: Self-pay | Admitting: Gynecologic Oncology

## 2014-07-18 ENCOUNTER — Ambulatory Visit: Payer: 59 | Attending: Gynecologic Oncology | Admitting: Gynecologic Oncology

## 2014-07-18 ENCOUNTER — Telehealth: Payer: Self-pay

## 2014-07-18 VITALS — BP 123/63 | HR 73 | Temp 98.1°F | Resp 22 | Ht 66.0 in | Wt 187.1 lb

## 2014-07-18 DIAGNOSIS — N839 Noninflammatory disorder of ovary, fallopian tube and broad ligament, unspecified: Secondary | ICD-10-CM | POA: Insufficient documentation

## 2014-07-18 DIAGNOSIS — N838 Other noninflammatory disorders of ovary, fallopian tube and broad ligament: Secondary | ICD-10-CM

## 2014-07-18 NOTE — Patient Instructions (Signed)
Followup with Dr. Toney Rakes next year for your annual exam. Please call our office in the future with any questions or concerns you have.

## 2014-07-18 NOTE — Telephone Encounter (Signed)
Patient said [facility] lady told her we needed to fax them her office notes from last visit and return to work date.  Patient's return to work date was 07/13/14 and the fax # for [employer] [facility] is [phone]. I told patient I will take care of it.

## 2014-07-18 NOTE — Progress Notes (Signed)
Consult Note: Gyn-Onc  Consult was requested by Dr. Toney Rakes for the evaluation of Dasha Bryant-Briggs 59 y.o. female  CC:  Chief Complaint  Patient presents with  . ovarian mass    New consult    Assessment/Plan:  Ms. Anitria Andon  is a 59 y.o.  year old with a left ovarian 2.2cm calcified nodule, which is almost certainly benign in nature and is asymptomatic. She has severe pelvic adhesive disease and surgical removal may require a morbid laparotomy with possible bowel resection.   I agree that this mass is almost certainly benign given the very subtle 0.5-0.7cm increase in growth in the past 2.5years. Additionally it is avascular and associated with a normal CA 125 (1).  Because it is asymptomatic, I believe that surgery will introduce more harm than benefit.  I am not recommending screening ultrasounds of this mass. Repeat imaging should only be performed if the patient develops pelvic symptoms such as pain or pressure and we need to know if this is secondary to an increasing size of the ovary , in which case we evaluation for surgery could be contemplated.  All questions were answered and the patient stated she felt satisfied and in agreement with the plan.   HPI:  Alline Pio is a very pleasant G1P1  Who is seen in consultation at the request of Dr. Toney Rakes for a left ovarian calcified mass. The patient reports having a routine ultrasound performed in 2013 that measured and ovary containing a calcified nodule 14 x 13 x 14 mm. Repeat imaging was performed in March 2016. This showed that the mass it slightly increased in size and was not 2.2 x 2.2 x 2.1 cm. It was a vascular. There was no fluid in the cul-de-sac were no other signs concerning for malignancy. The patient is asymptomatic.    she went to the operating room with Dr. Toney Rakes on 06/21/2014. Surgery was attempted and included diagnostic laparoscopy. Due to dense adhesions between the small intestine in  the anterior abdominal wall and uterus and obliteration of the cul-de-sac making removal of the left ovary not possible surgery was aborted. The patient has been healing well since surgery with no complaints.  Preoperative CA-125 evaluation was normal at 1. She is no family history for GYN or breast malignancy.   Prior surgeries include a total abdominal hysterectomy for symptomatic uterine fibroids a right salpingo-oophorectomy was performed at that same surgery. This was performed in 1991. She's had no other prior pelvic surgeries and no history of pelvic inflammatory disease.  Current Meds:  Outpatient Encounter Prescriptions as of 07/18/2014  Medication Sig  . calcium carbonate (OS-CAL) 600 MG TABS Take 600 mg by mouth 2 (two) times daily with a meal.    . docusate sodium (COLACE) 100 MG capsule Take 100 mg by mouth 2 (two) times daily.  . hydrochlorothiazide (MICROZIDE) 12.5 MG capsule TAKE 1 CAPSULE BY MOUTH EVERY MORNING  . ibuprofen (ADVIL,MOTRIN) 600 MG tablet Take 1 tablet (600 mg total) by mouth every 6 (six) hours as needed for mild pain.  . NONFORMULARY OR COMPOUNDED ITEM Estradiol .02% 1 ML Prefilled Applicator Sig: apply vaginally twice a week #90 Day Supply with 4 refills (Patient taking differently: Place 1 application vaginally 2 (two) times a week. Estradiol .02% 1 ML Prefilled Applicator Sig: apply vaginally twice a week #90 Day Supply with 4 refills)  . vitamin E 600 UNIT capsule Take 600 Units by mouth daily.    . metoCLOPramide (REGLAN) 10 MG  tablet Take 1 tablet (10 mg total) by mouth 3 (three) times daily with meals. (Patient not taking: Reported on 07/12/2014)  . oxyCODONE-acetaminophen (PERCOCET) 5-325 MG per tablet Take 1 tablet by mouth every 4 (four) hours as needed for severe pain. (Patient not taking: Reported on 07/12/2014)   No facility-administered encounter medications on file as of 07/18/2014.    Allergy:  Allergies  Allergen Reactions  . Adhesive [Tape]  Rash  . Latex Rash    Social Hx:   History   Social History  . Marital Status: Married    Spouse Name: N/A  . Number of Children: N/A  . Years of Education: N/A   Occupational History  . Not on file.   Social History Main Topics  . Smoking status: Former Smoker -- 0.25 packs/day    Types: Cigarettes    Quit date: 04/08/1977  . Smokeless tobacco: Never Used  . Alcohol Use: 0.0 oz/week    0 Standard drinks or equivalent per week     Comment: socially -beer on weekends  . Drug Use: No  . Sexual Activity:    Partners: Male    Birth Control/ Protection: Post-menopausal     Comment: hysterectomy   Other Topics Concern  . Not on file   Social History Narrative    Past Surgical Hx:  Past Surgical History  Procedure Laterality Date  . Abdominal hysterectomy  1991  . Wisdom tooth extraction    . Colonoscopy    . Laparoscopy  06/21/2014    Procedure: LAPAROSCOPY DIAGNOSTIC;  Surgeon: Terrance Mass, MD;  Location: [facility];  Service: Gynecology;;    Past Medical Hx:  Past Medical History  Diagnosis Date  . Trichomonas 04/19/2009  . Adenomatous polyp of colon 2011  . SVD (spontaneous vaginal delivery)     x 1  . Hypertension   . Arthritis     knee - no meds    Past Gynecological History:  TAH RSO for fibroids in 1991, no prior abnormal paps.  Patient's last menstrual period was 01/06/1990.  Family Hx:  Family History  Problem Relation Age of Onset  . Hypertension Mother   . Heart disease Mother   . Cancer Paternal Grandmother     leukemia  . Cancer Paternal Grandfather     lung    Review of Systems:  Constitutional  Feels well,    ENT Normal appearing ears and nares bilaterally Skin/Breast  No rash, sores, jaundice, itching, dryness Cardiovascular  No chest pain, shortness of breath, or edema  Pulmonary  No cough or wheeze.  Gastro Intestinal  No nausea, vomitting, or diarrhoea. No bright red blood per rectum, no abdominal pain, change in bowel  movement, or constipation.  Genito Urinary  No frequency, urgency, dysuria, see HPI Musculo Skeletal  No myalgia, arthralgia, joint swelling or pain  Neurologic  No weakness, numbness, change in gait,  Psychology  No depression, anxiety, insomnia.   Vitals:  Blood pressure 123/63, pulse 73, temperature 98.1 F (36.7 C), temperature source Oral, resp. rate 22, height 5\' 6"  (1.676 m), weight 187 lb 1.6 oz (84.868 kg), last menstrual period 01/06/1990.  Physical Exam: WD in NAD Neck  Supple NROM, without any enlargements.  Lymph Node Survey No cervical supraclavicular or inguinal adenopathy Cardiovascular  Pulse normal rate, regularity and rhythm. S1 and S2 normal.  Lungs  Clear to auscultation bilateraly, without wheezes/crackles/rhonchi. Good air movement.  Skin  No rash/lesions/breakdown  Psychiatry  Alert and oriented to person, place,  and time  Abdomen  Normoactive bowel sounds, abdomen soft, non-tender and overweight without evidence of hernia. Well healed incisions. Back No CVA tenderness Genito Urinary  Vulva/vagina: Normal external female genitalia.  No lesions. No discharge or bleeding.  Bladder/urethra:  No lesions or masses, well supported bladder  Vagina: normal  Cervix: surgically absent  Uterus: surgically absent  Adnexa: no palpable adnexal masses. Rectal  Good tone, no masses no cul de sac nodularity.  Extremities  No bilateral cyanosis, clubbing or edema.   Donaciano Eva, MD   07/18/2014, 4:22 PM

## 2014-12-05 ENCOUNTER — Other Ambulatory Visit: Payer: Self-pay | Admitting: Gynecology

## 2014-12-05 NOTE — Telephone Encounter (Signed)
Medication last filled on 05/17/14

## 2015-02-23 ENCOUNTER — Other Ambulatory Visit: Payer: Self-pay

## 2015-02-23 DIAGNOSIS — Z1231 Encounter for screening mammogram for malignant neoplasm of breast: Secondary | ICD-10-CM

## 2015-03-08 ENCOUNTER — Ambulatory Visit
Admission: RE | Admit: 2015-03-08 | Discharge: 2015-03-08 | Disposition: A | Discharge status: Home / Self Care | Payer: 59 | Source: Ambulatory Visit

## 2015-03-08 DIAGNOSIS — Z1231 Encounter for screening mammogram for malignant neoplasm of breast: Secondary | ICD-10-CM | POA: Diagnosis not present

## 2015-03-13 MED FILL — HYDROCHLOROTHIAZIDE 12.5 MG: 12.5 | 90 days supply | Qty: 90 | Fill #1

## 2015-03-27 ENCOUNTER — Ambulatory Visit (INDEPENDENT_AMBULATORY_CARE_PROVIDER_SITE_OTHER): Payer: 59 | Admitting: Gynecology

## 2015-03-27 ENCOUNTER — Encounter: Payer: Self-pay | Admitting: Gynecology

## 2015-03-27 ENCOUNTER — Other Ambulatory Visit (HOSPITAL_COMMUNITY)
Admission: RE | Admit: 2015-03-27 | Discharge: 2015-03-27 | Disposition: A | Discharge status: Home / Self Care | Payer: 59 | Source: Ambulatory Visit | Attending: Gynecology | Admitting: Gynecology

## 2015-03-27 VITALS — BP 120/70 | Ht 65.0 in | Wt 190.0 lb

## 2015-03-27 DIAGNOSIS — Z1151 Encounter for screening for human papillomavirus (HPV): Secondary | ICD-10-CM | POA: Insufficient documentation

## 2015-03-27 DIAGNOSIS — M62838 Other muscle spasm: Secondary | ICD-10-CM | POA: Diagnosis not present

## 2015-03-27 DIAGNOSIS — Z79890 Hormone replacement therapy (postmenopausal): Secondary | ICD-10-CM | POA: Diagnosis not present

## 2015-03-27 DIAGNOSIS — N952 Postmenopausal atrophic vaginitis: Secondary | ICD-10-CM

## 2015-03-27 DIAGNOSIS — Z01419 Encounter for gynecological examination (general) (routine) without abnormal findings: Secondary | ICD-10-CM

## 2015-03-27 DIAGNOSIS — Z780 Asymptomatic menopausal state: Secondary | ICD-10-CM

## 2015-03-27 MED ORDER — CYCLOBENZAPRINE HCL 5 MG PO TABS: 5.0000 mg | ORAL_TABLET | Freq: Three times a day (TID) | ORAL | Status: DC | PRN

## 2015-03-27 MED ORDER — HYDROCHLOROTHIAZIDE 12.5 MG PO CAPS: 12.5000 mg | ORAL_CAPSULE | Freq: Every morning | ORAL | Status: DC

## 2015-03-27 MED ORDER — NONFORMULARY OR COMPOUNDED ITEM: Status: DC

## 2015-03-27 MED FILL — CYCLOBENZAPRINE 5 MG TABLET: 5 | 10 days supply | Qty: 30 | Fill #0

## 2015-03-27 NOTE — Progress Notes (Signed)
Belinda Fowler [birth year] [id number]   History:    60 y.o.  for annual gyn exam with no complaints today. Patient has done well with vaginal estrogen twice a week for her atrophy and reports no vaginal bleeding. 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  Review of her record indicated in 2016 she had been taken to the operating room as a results of abdominal discomfort and a small left ovarian calcified nodule measuring 2.2 cm. She had a normal C1 25. At time of her surgery there was lysis of abdominal pelvic adhesions her cul-de-sac was encased in adhesions that the ovaries were not accessible so an abdominal approach and a later date was going to be scheduled. She was referred to the GYN oncologist Dr. Denman George who eventually solid patient in June of last year and reported the following:  "She has severe pelvic adhesive disease and surgical removal may require a morbid laparotomy with possible bowel resection.   I agree that this mass is almost certainly benign given the very subtle 0.5-0.7cm increase in growth in the past 2.5years. Additionally it is avascular and associated with a normal CA 125 (1).  Because it is asymptomatic, I believe that surgery will introduce more harm than benefit.  I am not recommending screening ultrasounds of this mass. Repeat imaging should only be performed if the patient develops pelvic symptoms such as pain or pressure and we need to know if this is secondary to an increasing size of the ovary , in which case we evaluation for surgery could be contemplated."  Patient with no past history of abnormal Pap smear. Her bone density study was normal in 2015. She had benign colon polyps removed last year she is on a 5 year recall.  Past medical history,surgical  history, family history and social history were all reviewed and documented in the EPIC chart.  Gynecologic History Patient's last menstrual period was 01/06/1990. Contraception: post menopausal status Last Pap: 2012. Results were: normal Last mammogram: 2017. Results were: normal  Obstetric History OB History  Gravida Para Term Preterm AB SAB TAB Ectopic Multiple Living  2 1 1  1 1    1     # Outcome Date GA Lbr Len/2nd Weight Sex Delivery Anes PTL Lv  2 SAB           1 Term     F Vag-Spont  N Y       ROS: A ROS was performed and pertinent positives and negatives are included in the history.  GENERAL: No fevers or chills. HEENT: No change in vision, no earache, sore throat or sinus congestion. NECK: No pain or stiffness. CARDIOVASCULAR: No chest pain or pressure. No palpitations. PULMONARY: No shortness of breath, cough or wheeze. GASTROINTESTINAL: No abdominal pain, nausea, vomiting or diarrhea, melena or bright red blood per rectum. GENITOURINARY: No urinary frequency, urgency, hesitancy or dysuria. MUSCULOSKELETAL: No joint or muscle pain, no back pain, no recent trauma. DERMATOLOGIC: No rash, no itching, no lesions. ENDOCRINE: No polyuria, polydipsia, no heat or cold intolerance. No recent change in weight. HEMATOLOGICAL: No anemia or easy bruising or bleeding. NEUROLOGIC: No headache, seizures, numbness, tingling or weakness. PSYCHIATRIC: No depression, no loss of interest in normal activity or change in sleep pattern.  Exam: chaperone present  BP 120/70 mmHg  Ht 5\' 5"  (1.651 m)  Wt 190 lb (86.183 kg)  BMI 31.62 kg/m2  LMP 01/06/1990  Body mass index is 31.62 kg/(m^2).  General appearance : Well developed well nourished female. No acute distress HEENT: Eyes: no retinal hemorrhage or exudates,  Neck supple, trachea midline, no carotid bruits, no thyroidmegaly Lungs: Clear to auscultation, no rhonchi or wheezes, or rib retractions  Heart: Regular rate and rhythm, no  murmurs or gallops Breast:Examined in sitting and supine position were symmetrical in appearance, no palpable masses or tenderness,  no skin retraction, no nipple inversion, no nipple discharge, no skin discoloration, no axillary or supraclavicular lymphadenopathy Abdomen: no palpable masses or tenderness, no rebound or guarding Extremities: no edema or skin discoloration or tenderness  Pelvic:  Bartholin, Urethra, Skene Glands: Within normal limits             Vagina: No gross lesions or discharge  Cervix: No gross lesions or discharge  Uterus  anteverted, normal size, shape and consistency, non-tender and mobile  Adnexa  Without masses or tenderness  Anus and perineum  normal   Rectovaginal  normal sphincter tone without palpated masses or tenderness             Hemoccult colonoscopy less than 6 months ago benign polyps removed     Assessment/Plan:  60 y.o. female for annual exam postmenopausal patient doing well on vaginal estrogen twice a week for vaginal atrophy. Reports no vaginal bleeding. Patient with no abdominal pelvic pain after lysis of adhesions laparoscopically last year. Since his been less than a year will follow-up with an ultrasound of the small left ovarian calcification next year. Her Pap smear with HPV screening was done today. Patient to schedule her bone density study next month. She will return back to the office later this week for the following screening blood work: Fasting lipid profile, comprehensive metabolic panel, TSH, CBC, and urinalysis. We discussed importance of calcium vitamin D and weightbearing exercises for osteoporosis prevention.   Terrance Mass MD, 11:14 AM 03/27/2015

## 2015-03-28 LAB — URINALYSIS W MICROSCOPIC + REFLEX CULTURE
Bacteria, UA: NONE SEEN [HPF]
Bilirubin Urine: NEGATIVE
Casts: NONE SEEN [LPF]
Crystals: NONE SEEN [HPF]
Glucose, UA: NEGATIVE
Hgb urine dipstick: NEGATIVE
Ketones, ur: NEGATIVE
Leukocytes, UA: NEGATIVE
Nitrite: NEGATIVE
Protein, ur: NEGATIVE
RBC / HPF: NONE SEEN RBC/HPF (ref <=2)
Specific Gravity, Urine: 1.02 (ref 1.001–1.035)
WBC, UA: NONE SEEN WBC/HPF (ref <=5)
Yeast: NONE SEEN [HPF]
pH: 7.5 (ref 5.0–8.0)

## 2015-03-29 LAB — CYTOLOGY - PAP

## 2015-03-31 ENCOUNTER — Other Ambulatory Visit: Payer: 59

## 2015-03-31 DIAGNOSIS — Z01419 Encounter for gynecological examination (general) (routine) without abnormal findings: Secondary | ICD-10-CM | POA: Diagnosis not present

## 2015-03-31 LAB — CBC WITH DIFFERENTIAL/PLATELET
Basophils Absolute: 0 10*3/uL (ref 0.0–0.1)
Basophils Relative: 0 % (ref 0–1)
Eosinophils Absolute: 0.1 10*3/uL (ref 0.0–0.7)
Eosinophils Relative: 1 % (ref 0–5)
HCT: 39.5 % (ref 36.0–46.0)
Hemoglobin: 13.6 g/dL (ref 12.0–15.0)
Lymphocytes Relative: 43 % (ref 12–46)
Lymphs Abs: 3.7 10*3/uL (ref 0.7–4.0)
MCH: 31.6 pg (ref 26.0–34.0)
MCHC: 34.4 g/dL (ref 30.0–36.0)
MCV: 91.6 fL (ref 78.0–100.0)
MPV: 9.4 fL (ref 8.6–12.4)
Monocytes Absolute: 0.6 10*3/uL (ref 0.1–1.0)
Monocytes Relative: 7 % (ref 3–12)
Neutro Abs: 4.2 10*3/uL (ref 1.7–7.7)
Neutrophils Relative %: 49 % (ref 43–77)
Platelets: 284 10*3/uL (ref 150–400)
RBC: 4.31 MIL/uL (ref 3.87–5.11)
RDW: 14.5 % (ref 11.5–15.5)
WBC: 8.6 10*3/uL (ref 4.0–10.5)

## 2015-03-31 LAB — COMPREHENSIVE METABOLIC PANEL
ALT: 13 U/L (ref 6–29)
AST: 18 U/L (ref 10–35)
Albumin: 4.2 g/dL (ref 3.6–5.1)
Alkaline Phosphatase: 46 U/L (ref 33–130)
BUN: 12 mg/dL (ref 7–25)
CO2: 26 mmol/L (ref 20–31)
Calcium: 9 mg/dL (ref 8.6–10.4)
Chloride: 102 mmol/L (ref 98–110)
Creat: 0.79 mg/dL (ref 0.50–1.05)
Glucose, Bld: 92 mg/dL (ref 65–99)
Potassium: 4.1 mmol/L (ref 3.5–5.3)
Sodium: 138 mmol/L (ref 135–146)
Total Bilirubin: 0.9 mg/dL (ref 0.2–1.2)
Total Protein: 7.2 g/dL (ref 6.1–8.1)

## 2015-03-31 LAB — LIPID PANEL
Cholesterol: 219 mg/dL — ABNORMAL HIGH (ref 125–200)
HDL: 68 mg/dL (ref >=46)
LDL Cholesterol: 122 mg/dL (ref <130)
Total CHOL/HDL Ratio: 3.2 Ratio (ref <=5.0)
Triglycerides: 145 mg/dL (ref <150)
VLDL: 29 mg/dL (ref <30)

## 2015-03-31 LAB — TSH: TSH: 3.52 mIU/L

## 2015-05-09 ENCOUNTER — Ambulatory Visit (INDEPENDENT_AMBULATORY_CARE_PROVIDER_SITE_OTHER): Payer: 59

## 2015-05-09 ENCOUNTER — Other Ambulatory Visit: Payer: Self-pay | Admitting: Gynecology

## 2015-05-09 DIAGNOSIS — Z780 Asymptomatic menopausal state: Secondary | ICD-10-CM

## 2015-05-09 DIAGNOSIS — Z13820 Encounter for screening for osteoporosis: Secondary | ICD-10-CM

## 2015-06-26 MED FILL — HYDROCHLOROTHIAZIDE 12.5 MG: 12.5 | 90 days supply | Qty: 90 | Fill #2

## 2015-10-09 MED FILL — HYDROCHLOROTHIAZIDE 12.5 MG: 12.5 | 90 days supply | Qty: 90 | Fill #3

## 2016-01-24 MED FILL — HYDROCHLOROTHIAZIDE 12.5 MG: 12.5 | 90 days supply | Qty: 90 | Fill #0

## 2016-02-26 ENCOUNTER — Other Ambulatory Visit: Payer: Self-pay | Admitting: Gynecology

## 2016-02-26 DIAGNOSIS — Z1231 Encounter for screening mammogram for malignant neoplasm of breast: Secondary | ICD-10-CM

## 2016-03-07 DIAGNOSIS — H5203 Hypermetropia, bilateral: Secondary | ICD-10-CM | POA: Diagnosis not present

## 2016-03-07 DIAGNOSIS — H524 Presbyopia: Secondary | ICD-10-CM | POA: Diagnosis not present

## 2016-03-11 ENCOUNTER — Ambulatory Visit: Payer: 59

## 2016-03-21 ENCOUNTER — Ambulatory Visit
Admission: RE | Admit: 2016-03-21 | Discharge: 2016-03-21 | Disposition: A | Discharge status: Home / Self Care | Payer: 59 | Source: Ambulatory Visit | Attending: Gynecology | Admitting: Gynecology

## 2016-03-21 DIAGNOSIS — Z1231 Encounter for screening mammogram for malignant neoplasm of breast: Secondary | ICD-10-CM

## 2016-03-27 ENCOUNTER — Encounter: Payer: 59 | Admitting: Gynecology

## 2016-03-29 ENCOUNTER — Ambulatory Visit (INDEPENDENT_AMBULATORY_CARE_PROVIDER_SITE_OTHER): Payer: 59 | Admitting: Gynecology

## 2016-03-29 ENCOUNTER — Encounter: Payer: Self-pay | Admitting: Gynecology

## 2016-03-29 VITALS — BP 122/80 | Ht 65.0 in | Wt 188.0 lb

## 2016-03-29 DIAGNOSIS — Z01419 Encounter for gynecological examination (general) (routine) without abnormal findings: Secondary | ICD-10-CM | POA: Diagnosis not present

## 2016-03-29 DIAGNOSIS — Z780 Asymptomatic menopausal state: Secondary | ICD-10-CM | POA: Diagnosis not present

## 2016-03-29 DIAGNOSIS — Z86010 Personal history of colonic polyps: Secondary | ICD-10-CM

## 2016-03-29 DIAGNOSIS — Z79890 Hormone replacement therapy (postmenopausal): Secondary | ICD-10-CM

## 2016-03-29 DIAGNOSIS — N952 Postmenopausal atrophic vaginitis: Secondary | ICD-10-CM

## 2016-03-29 LAB — CBC WITH DIFFERENTIAL/PLATELET
Basophils Absolute: 0 cells/uL (ref 0–200)
Basophils Relative: 0 %
Eosinophils Absolute: 77 cells/uL (ref 15–500)
Eosinophils Relative: 1 %
HCT: 37.3 % (ref 35.0–45.0)
Hemoglobin: 12.7 g/dL (ref 11.7–15.5)
Lymphocytes Relative: 46 %
Lymphs Abs: 3542 cells/uL (ref 850–3900)
MCH: 31.1 pg (ref 27.0–33.0)
MCHC: 34 g/dL (ref 32.0–36.0)
MCV: 91.2 fL (ref 80.0–100.0)
MPV: 9.3 fL (ref 7.5–12.5)
Monocytes Absolute: 462 cells/uL (ref 200–950)
Monocytes Relative: 6 %
Neutro Abs: 3619 cells/uL (ref 1500–7800)
Neutrophils Relative %: 47 %
Platelets: 284 10*3/uL (ref 140–400)
RBC: 4.09 MIL/uL (ref 3.80–5.10)
RDW: 14.2 % (ref 11.0–15.0)
WBC: 7.7 10*3/uL (ref 3.8–10.8)

## 2016-03-29 LAB — COMPREHENSIVE METABOLIC PANEL
ALT: 13 U/L (ref 6–29)
AST: 18 U/L (ref 10–35)
Albumin: 4.1 g/dL (ref 3.6–5.1)
Alkaline Phosphatase: 44 U/L (ref 33–130)
BUN: 14 mg/dL (ref 7–25)
CO2: 26 mmol/L (ref 20–31)
Calcium: 8.9 mg/dL (ref 8.6–10.4)
Chloride: 105 mmol/L (ref 98–110)
Creat: 0.78 mg/dL (ref 0.50–1.05)
Glucose, Bld: 80 mg/dL (ref 65–99)
Potassium: 4.2 mmol/L (ref 3.5–5.3)
Sodium: 140 mmol/L (ref 135–146)
Total Bilirubin: 0.5 mg/dL (ref 0.2–1.2)
Total Protein: 7.2 g/dL (ref 6.1–8.1)

## 2016-03-29 LAB — LIPID PANEL
Cholesterol: 211 mg/dL — ABNORMAL HIGH (ref <200)
HDL: 60 mg/dL (ref >50)
LDL Cholesterol: 130 mg/dL — ABNORMAL HIGH (ref <100)
Total CHOL/HDL Ratio: 3.5 Ratio (ref <5.0)
Triglycerides: 107 mg/dL (ref <150)
VLDL: 21 mg/dL (ref <30)

## 2016-03-29 LAB — TSH: TSH: 3.48 mIU/L

## 2016-03-29 MED ORDER — NONFORMULARY OR COMPOUNDED ITEM: 4 refills | Status: DC

## 2016-03-29 NOTE — Progress Notes (Signed)
Banita Bryant-Briggs September 14, 1955 [id number]   History:    61 y.o.  for annual gyn exam With no complaints today. Patient has done well on vaginal estrogen twice a week.Review of her record indicated in 2016 she had been taken to the operating room as a results of abdominal discomfort and a small left ovarian calcified nodule measuring 2.2 cm. She had a normal C1 25. At time of her surgery there was lysis of abdominal pelvic adhesions her cul-de-sac was encased in adhesions that the ovaries were not accessible so an abdominal approach and a later date was going to be scheduled. She was referred to the GYN oncologist Dr. Denman George who eventually solid patient in June of last year and reported the following:  "She has severe pelvic adhesive disease and surgical removal may require a morbid laparotomy with possible bowel resection.   I agree that this mass is almost certainly benign given the very subtle 0.5-0.7cm increase in growth in the past 2.5years. Additionally it is avascular and associated with a normal CA 125 (1).  Because it is asymptomatic, I believe that surgery will introduce more harm than benefit.  I am not recommending screening ultrasounds of this mass. Repeat imaging should only be performed if the patient develops pelvic symptoms such as pain or pressure and we need to know if this is secondary to an increasing size of the ovary , in which case we evaluation for surgery could be contemplated."  Patient with no past history of abnormal Pap smear. Her bone density study was normal in 2017. Patient has history of benign colon polyps she had colonoscopy in 2011 in 2017 respectively. Patient received her flu vaccine at work.    Past medical history,surgical history, family history and social history were all reviewed and documented in the EPIC chart.  Gynecologic History Patient's last menstrual period was 01/06/1990. Contraception: status post hysterectomy Last Pap: 2017.  Results were: normal Last mammogram: 2018. Results were: normal  Obstetric History OB History  Gravida Para Term Preterm AB Living  2 1 1   1 1   SAB TAB Ectopic Multiple Live Births  1       1    # Outcome Date GA Lbr Len/2nd Weight Sex Delivery Anes PTL Lv  2 SAB           1 Term     F Vag-Spont  N LIV       ROS: A ROS was performed and pertinent positives and negatives are included in the history.  GENERAL: No fevers or chills. HEENT: No change in vision, no earache, sore throat or sinus congestion. NECK: No pain or stiffness. CARDIOVASCULAR: No chest pain or pressure. No palpitations. PULMONARY: No shortness of breath, cough or wheeze. GASTROINTESTINAL: No abdominal pain, nausea, vomiting or diarrhea, melena or bright red blood per rectum. GENITOURINARY: No urinary frequency, urgency, hesitancy or dysuria. MUSCULOSKELETAL: No joint or muscle pain, no back pain, no recent trauma. DERMATOLOGIC: No rash, no itching, no lesions. ENDOCRINE: No polyuria, polydipsia, no heat or cold intolerance. No recent change in weight. HEMATOLOGICAL: No anemia or easy bruising or bleeding. NEUROLOGIC: No headache, seizures, numbness, tingling or weakness. PSYCHIATRIC: No depression, no loss of interest in normal activity or change in sleep pattern.     Exam: chaperone present  BP 122/80   Ht 5\' 5"  (1.651 m)   Wt 188 lb (85.3 kg)   LMP 01/06/1990   BMI 31.28 kg/m   Body mass index is 31.28  kg/m.  General appearance : Well developed well nourished female. No acute distress HEENT: Eyes: no retinal hemorrhage or exudates,  Neck supple, trachea midline, no carotid bruits, no thyroidmegaly Lungs: Clear to auscultation, no rhonchi or wheezes, or rib retractions  Heart: Regular rate and rhythm, no murmurs or gallops Breast:Examined in sitting and supine position were symmetrical in appearance, no palpable masses or tenderness,  no skin retraction, no nipple inversion, no nipple discharge, no skin  discoloration, no axillary or supraclavicular lymphadenopathy Abdomen: no palpable masses or tenderness, no rebound or guarding Extremities: no edema or skin discoloration or tenderness  Pelvic:  Bartholin, Urethra, Skene Glands: Within normal limits             Vagina: No gross lesions or discharge  Cervix: Absent  Uterus  absent  Adnexa  Without masses or tenderness  Anus and perineum  normal   Rectovaginal  normal sphincter tone without palpated masses or tenderness             Hemoccult colonoscopy less than 12 months ago     Assessment/Plan:  61 y.o. female for annual exam doing well with vaginal estrogen twice a week. The following fasting screening blood work was ordered today: Comprehensive metabolic panel, fasting lipid profile, TSH, CBC, and urinalysis. Pap smear not indicated according to the new guidelines. We discussed importance of calcium vitamin D and weightbearing exercises for osteoporosis prevention. A requisition was provided to see if her insurance will cover for her to have the shingles vaccine.   Terrance Mass MD, 11:58 AM 03/29/2016

## 2016-03-29 NOTE — Patient Instructions (Signed)
1. What is shingles? Shingles is a painful skin rash, often with blisters. It is also called Herpes Zoster, or just Zoster. A shingles rash usually appears on one side of the face or body and lasts from 2 to 4 weeks. Its main symptom is pain, which can be quite severe. Other symptoms of shingles can include fever, headache, chills and upset stomach. Very rarely, a shingles infection can lead to pneumonia, hearing problems, blindness, brain inflammation (encephalitis) or death. For about 1 person in 5, severe pain can continue even long after the rash clears up. This is called post-herpetic neuralgia. Shingles is caused by the Varicella Zoster virus, the same virus that causes chickenpox. Only someone who has had chickenpox-or, rarely, has gotten chickenpox vaccine-can get shingles. The virus stays in your body, and can cause shingles many years later. You can't catch shingles from another person with shingles. However, a person who has never had chickenpox (or chicken pox vaccine) could get chickenpox from someone with shingles. This is not very common. Shingles is far more common in people 37 years of age and older than in younger people. It is also more common in people whose immune systems are weakened because of a disease such as cancer, or drugs such as steroids or chemotherapy. At least 1 million people a year in the [place] get shingles. 2. Shingles vaccine A vaccine for shingles was licensed in 0947. In clinical trials, the vaccine reduced the risk of shingles by 50%. It can also reduce pain in people who still get shingles after being vaccinated. A single dose of shingles vaccine is recommended for adults 23 years of age and older. 3. Some people should not get shingles vaccine or should wait A person should not get shingles vaccine who:  has ever had a life-threatening allergic reaction to gelatin, the antibiotic neomycin, or any other component of shingles vaccine. Tell your doctor if  you have any severe allergies.  has a weakened immune system because of current:  AIDS or another disease that affects the immune system,  treatment with drugs that affect the immune system, such as prolonged use of high-dose steroids,  cancer treatment such as radiation or chemotherapy,  cancer affecting the bone marrow or lymphatic system, such as leukemia or lymphoma.  is pregnant, or might be pregnant. Women should not become pregnant until at least 4 weeks after getting shingles vaccine. Someone with a minor acute illness, such as a cold, may be vaccinated. But anyone with a moderate or severe acute illness should usually wait until they recover before get ting the vaccine. This includes anyone with a temperature of 101.3 F or higher. 4. What are the risks from shingles vaccine? A vaccine, like any medicine, could possibly cause serious problems, such as severe allergic reactions. However, the risk of a vaccine causing serious harm, or death, is extremely small. No serious problems have been identified with shingles vaccine. Mild problems  Redness, soreness, swelling, or itching at the site of the injection (about 1 person in 3).  Headache (about 1 person in 68). Like all vaccines, shingles vaccine is being closely monitored for unusual or severe problems. 5. What if there is a serious reaction? What should I look for?  Look for anything that concerns you, such as signs of a severe allergic reaction, very high fever, or behavior changes. Signs of a severe allergic reaction can include hives, swelling of the face and throat, difficulty breathing, a fast heartbeat, dizziness, and weakness. These  would start a few minutes to a few hours after the vaccination. What should I do?  If you think it is a severe allergic reaction or other emergency that can't wait, call 9-1-1 or get the person to the nearest hospital. Otherwise, call your doctor.  Afterward, the reaction should be  reported to the Vaccine Adverse Event Reporting System (VAERS). Your doctor might file this report, or you can do it yourself through the VAERS web site at www.vaers.SamedayNews.es or by calling 7783040846. VAERS is only for reporting reactions. They do not give medical advice. 6. How can I learn more?  Ask your doctor.  Call your local or state health department.  Contact the Centers for Disease Control and Prevention (CDC):  Call (959) 383-6784(1-800-CDC-INFO) or  Visit CDC's website at http://hunter.com/ CDC Vaccine Information Statement (VIS) Shingles Vaccine (11/17/2007) This information is not intended to replace advice given to you by your health care provider. Make sure you discuss any questions you have with your health care provider. Document Released: 11/25/2005 Document Revised: 09/27/2015 Document Reviewed: 05/20/2012 Elsevier Interactive Patient Education  2017 [employer].

## 2016-03-30 LAB — URINALYSIS W MICROSCOPIC + REFLEX CULTURE
Bacteria, UA: NONE SEEN [HPF]
Bilirubin Urine: NEGATIVE
Casts: NONE SEEN [LPF]
Crystals: NONE SEEN [HPF]
Glucose, UA: NEGATIVE
Hgb urine dipstick: NEGATIVE
Ketones, ur: NEGATIVE
Leukocytes, UA: NEGATIVE
Nitrite: NEGATIVE
Protein, ur: NEGATIVE
RBC / HPF: NONE SEEN RBC/HPF (ref <=2)
Specific Gravity, Urine: 1.014 (ref 1.001–1.035)
Squamous Epithelial / LPF: NONE SEEN [HPF] (ref <=5)
WBC, UA: NONE SEEN WBC/HPF (ref <=5)
Yeast: NONE SEEN [HPF]
pH: 6 (ref 5.0–8.0)

## 2016-04-01 ENCOUNTER — Telehealth: Payer: Self-pay

## 2016-04-01 MED ORDER — CYCLOBENZAPRINE HCL 5 MG PO TABS: 5.0000 mg | ORAL_TABLET | Freq: Three times a day (TID) | ORAL | 0 refills | Status: DC | PRN

## 2016-04-01 NOTE — Telephone Encounter (Signed)
Patient said she forgot to ask at her visit if you would refill her Flexeril?

## 2016-04-01 NOTE — Telephone Encounter (Signed)
Find out who has prescribed her Flexeril in for what reason and they should give her the refill of his indicated this is a controlled substance.

## 2016-04-01 NOTE — Telephone Encounter (Signed)
Okay call in 30 tablets no refills

## 2016-04-01 NOTE — Telephone Encounter (Signed)
You have prescribed Flexeril for her several times in the past most recently you gave her #30 at her Feb annual last year. It appears they lasted her a year.

## 2016-04-02 MED FILL — CYCLOBENZAPRINE 5 MG TABLET: 5 | 10 days supply | Qty: 30 | Fill #0

## 2016-04-05 ENCOUNTER — Telehealth: Payer: Self-pay | Admitting: *Deleted

## 2016-04-05 MED ORDER — ZOSTER VAC RECOMB ADJUVANTED 50 MCG/0.5ML IM SUSR: 50.0000 ug | Freq: Once | INTRAMUSCULAR | 1 refills | Status: AC

## 2016-04-05 MED FILL — SHINGRIX VIAL KIT: 50 | 30 days supply | Qty: 1 | Fill #0

## 2016-04-05 NOTE — Telephone Encounter (Signed)
Megan pharmacist from [facility] called wanting to clarify if you want this patient to have the new ShingRix 50 mcg take one dose now and repeat 2nd dose in 2-6 months states CDC recommends this shingles Rx for 50 and up. Please advise

## 2016-04-05 NOTE — Telephone Encounter (Signed)
Yes if this is the new guidelines recommended

## 2016-04-05 NOTE — Telephone Encounter (Signed)
Per Jinny Blossom I placed Rx in epic as she directed me to with refill.

## 2016-05-07 ENCOUNTER — Other Ambulatory Visit: Payer: Self-pay | Admitting: Gynecology

## 2016-05-07 MED FILL — HYDROCHLOROTHIAZIDE 12.5 MG: 12.5 | 90 days supply | Qty: 90 | Fill #0

## 2016-06-04 MED FILL — SHINGRIX 50 MCG SUS: 50 | 30 days supply | Qty: 1 | Fill #1

## 2016-06-26 ENCOUNTER — Encounter: Payer: Self-pay | Admitting: Gynecology

## 2016-07-24 ENCOUNTER — Other Ambulatory Visit: Payer: Self-pay | Admitting: Gynecology

## 2016-07-26 DIAGNOSIS — M546 Pain in thoracic spine: Secondary | ICD-10-CM | POA: Diagnosis not present

## 2016-07-26 DIAGNOSIS — E7800 Pure hypercholesterolemia, unspecified: Secondary | ICD-10-CM | POA: Diagnosis not present

## 2016-07-26 DIAGNOSIS — N951 Menopausal and female climacteric states: Secondary | ICD-10-CM | POA: Diagnosis not present

## 2016-07-26 DIAGNOSIS — I10 Essential (primary) hypertension: Secondary | ICD-10-CM | POA: Diagnosis not present

## 2016-07-26 MED FILL — CYCLOBENZAPRINE 5 MG TABLET: 5 | 10 days supply | Qty: 30 | Fill #0

## 2016-08-19 MED FILL — HYDROCHLOROTHIAZIDE 12.5 MG: 12.5 | 90 days supply | Qty: 90 | Fill #1

## 2016-11-28 MED FILL — HYDROCHLOROTHIAZIDE 12.5 MG: 12.5 | 30 days supply | Qty: 30 | Fill #2

## 2017-01-01 ENCOUNTER — Other Ambulatory Visit: Payer: Self-pay

## 2017-01-01 NOTE — Telephone Encounter (Signed)
Former [facility] patient. At her 01/17/12 visit Dr. Moshe Salisbury wrote "Assessment/Plan:  61 y.o. female for annual exam who is overweight with a BMI 34.51 and hypertensive. We are going to start her on HCTZ 12.5 mg daily. I've asked her to maintain a log of her blood pressure readings daily and to followup with her internist Dr. Alroy Dust next week."  He continued to prescribe this medication for her with last Rx being this past march for #30 w 6 refills.  She has CE scheduled with you for 03/31/2017.

## 2017-01-06 MED FILL — HYDROCHLOROTHIAZIDE 12.5 MG: 12.5 | 90 days supply | Qty: 90 | Fill #0

## 2017-01-06 NOTE — Telephone Encounter (Signed)
I called patient to inform her and she said she had already taken care of it with her PCP. She said she was almost out and called PCP.  I explained that Dr. Marguerita Merles and Dr. Loetta Rough do not treat hypertension and Dr. Marguerita Merles recommended she let PCP prescribe this. Patient agreed.

## 2017-02-21 DIAGNOSIS — N951 Menopausal and female climacteric states: Secondary | ICD-10-CM | POA: Diagnosis not present

## 2017-02-21 DIAGNOSIS — M546 Pain in thoracic spine: Secondary | ICD-10-CM | POA: Diagnosis not present

## 2017-02-21 DIAGNOSIS — E7800 Pure hypercholesterolemia, unspecified: Secondary | ICD-10-CM | POA: Diagnosis not present

## 2017-02-21 DIAGNOSIS — I10 Essential (primary) hypertension: Secondary | ICD-10-CM | POA: Diagnosis not present

## 2017-03-04 ENCOUNTER — Other Ambulatory Visit: Payer: Self-pay | Admitting: Obstetrics & Gynecology

## 2017-03-04 DIAGNOSIS — Z1231 Encounter for screening mammogram for malignant neoplasm of breast: Secondary | ICD-10-CM

## 2017-03-20 MED FILL — CYCLOBENZAPRINE 5 MG TABLET: 5 | 10 days supply | Qty: 30 | Fill #0

## 2017-03-21 DIAGNOSIS — H524 Presbyopia: Secondary | ICD-10-CM | POA: Diagnosis not present

## 2017-03-21 DIAGNOSIS — H5203 Hypermetropia, bilateral: Secondary | ICD-10-CM | POA: Diagnosis not present

## 2017-03-24 ENCOUNTER — Ambulatory Visit
Admission: RE | Admit: 2017-03-24 | Discharge: 2017-03-24 | Disposition: A | Discharge status: Home / Self Care | Payer: 59 | Source: Ambulatory Visit | Attending: Obstetrics & Gynecology | Admitting: Obstetrics & Gynecology

## 2017-03-24 DIAGNOSIS — Z1231 Encounter for screening mammogram for malignant neoplasm of breast: Secondary | ICD-10-CM | POA: Diagnosis not present

## 2017-03-31 ENCOUNTER — Encounter: Payer: Self-pay | Admitting: Obstetrics & Gynecology

## 2017-03-31 ENCOUNTER — Ambulatory Visit (INDEPENDENT_AMBULATORY_CARE_PROVIDER_SITE_OTHER): Payer: 59 | Admitting: Obstetrics & Gynecology

## 2017-03-31 VITALS — BP 140/82 | Ht 65.25 in | Wt 185.0 lb

## 2017-03-31 DIAGNOSIS — Z01411 Encounter for gynecological examination (general) (routine) with abnormal findings: Secondary | ICD-10-CM | POA: Diagnosis not present

## 2017-03-31 DIAGNOSIS — Z780 Asymptomatic menopausal state: Secondary | ICD-10-CM | POA: Diagnosis not present

## 2017-03-31 DIAGNOSIS — Z90710 Acquired absence of both cervix and uterus: Secondary | ICD-10-CM

## 2017-03-31 DIAGNOSIS — Z01419 Encounter for gynecological examination (general) (routine) without abnormal findings: Secondary | ICD-10-CM

## 2017-03-31 DIAGNOSIS — N952 Postmenopausal atrophic vaginitis: Secondary | ICD-10-CM

## 2017-03-31 MED ORDER — ESTRADIOL 0.1 MG/GM VA CREA: 0.2500 | TOPICAL_CREAM | VAGINAL | 4 refills | Status: DC

## 2017-03-31 NOTE — Progress Notes (Signed)
Belinda Fowler 1956/01/21 [id number]   History:    62 y.o. G2P1A1L1 Married.    RP:  Established patient presenting for annual gyn exam   HPI: S/P Total Hysterectomy.  No pelvic pain.  No pain with IC.  Normal vaginal secretions.  Urine/BMs wnl.  Breasts wnl.  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  BMI 30.55.  Seen by Dr Denman George for a Left small calcified Ovarian nodule, her recommendations were:  I agree that this mass is almost certainly benign given the very subtle 0.5-0.7cm increase in growth in the past 2.5years. Additionally it is avascular and associated with a normal CA 125 (1). Because it is asymptomatic and the patient has extensive adhesive disease, I believe that surgery will introduce more harm than benefit. I am not recommending screening ultrasounds of this mass. Repeat imaging should only be performed if the patient develops pelvic symptoms such as pain or pressure and we need to know if this is secondary to an increasing size of the ovary , in which case we evaluation for surgery could be contemplated."   Past medical history,surgical history, family history and social history were all reviewed and documented in the EPIC chart.  Gynecologic History Patient's last menstrual period was 01/06/1990. Contraception: status post hysterectomy Last Pap: 03/2015. Results were: Negative Last mammogram: 03/2017. Results were: Negative Bone Density: 04/2015 Normal, will repeat at 3-4 yrs. Colonoscopy: 2015 on a 5 yr schedule  Obstetric History OB History  Gravida Para Term Preterm AB Living  2 1 1   1 1   SAB TAB Ectopic Multiple Live Births  1       1    # Outcome Date GA Lbr Len/2nd Weight Sex Delivery Anes PTL Lv  2 SAB           1 Term     F Vag-Spont  N LIV       ROS: A ROS was performed and pertinent positives and negatives are included in the history.  GENERAL: No fevers or chills. HEENT: No change in vision, no earache, sore throat or sinus congestion. NECK: No pain or  stiffness. CARDIOVASCULAR: No chest pain or pressure. No palpitations. PULMONARY: No shortness of breath, cough or wheeze. GASTROINTESTINAL: No abdominal pain, nausea, vomiting or diarrhea, melena or bright red blood per rectum. GENITOURINARY: No urinary frequency, urgency, hesitancy or dysuria. MUSCULOSKELETAL: No joint or muscle pain, no back pain, no recent trauma. DERMATOLOGIC: No rash, no itching, no lesions. ENDOCRINE: No polyuria, polydipsia, no heat or cold intolerance. No recent change in weight. HEMATOLOGICAL: No anemia or easy bruising or bleeding. NEUROLOGIC: No headache, seizures, numbness, tingling or weakness. PSYCHIATRIC: No depression, no loss of interest in normal activity or change in sleep pattern.     Exam:   BP 140/82   Ht 5' 5.25" (1.657 m)   Wt 185 lb (83.9 kg)   LMP 01/06/1990   BMI 30.55 kg/m   Body mass index is 30.55 kg/m.  General appearance : Well developed well nourished female. No acute distress HEENT: Eyes: no retinal hemorrhage or exudates,  Neck supple, trachea midline, no carotid bruits, no thyroidmegaly Lungs: Clear to auscultation, no rhonchi or wheezes, or rib retractions  Heart: Regular rate and rhythm, no murmurs or gallops Breast:Examined in sitting and supine position were symmetrical in appearance, no palpable masses or tenderness,  no skin retraction, no nipple inversion, no nipple discharge, no skin discoloration, no axillary or supraclavicular lymphadenopathy Abdomen: no palpable masses or tenderness, no rebound  or guarding Extremities: no edema or skin discoloration or tenderness  Pelvic: Vulva: Normal             Vagina: No gross lesions or discharge.  Pap reflex done.  Cervix/Uterus absent  Adnexa  Without masses or tenderness  Anus: Normal   Assessment/Plan:  62 y.o. female for annual exam   1. Encounter for gynecological examination with abnormal finding Gynecologic exam status post total hysterectomy.  No pelvic mass felt.  Pap  reflex test done on the vaginal vault.  Breast exam normal.  Recent screening mammogram negative February 2019.  Health labs with family physician.  Continue regular walking.  2. S/P total hysterectomy  3. Menopause present Well on no systemic hormone replacement therapy.  Continue with vitamin D and calcium supplements and regular weightbearing physical activity.  Last bone density was completely normal in March 2017.  Will repeat only at 3 or 4 years.  4. Post-menopausal atrophic vaginitis Well on estradiol cream twice a week.  No contraindication.  Represcribed.  Other orders - estradiol (ESTRACE VAGINAL) 0.1 MG/GM vaginal cream; Place 9.32 Applicatorfuls vaginally 2 (two) times a week. And a thin layer on vulva twice a week as needed  Princess Bruins MD, 10:02 AM 03/31/2017

## 2017-03-31 NOTE — Addendum Note (Signed)
Addended by: Thurnell Garbe A on: 03/31/2017 10:45 AM   Modules accepted: Orders

## 2017-03-31 NOTE — Patient Instructions (Signed)
1. Encounter for gynecological examination with abnormal finding Gynecologic exam status post total hysterectomy.  No pelvic mass felt.  Pap reflex test done on the vaginal vault.  Breast exam normal.  Recent screening mammogram negative February 2019.  Health labs with family physician.  Continue regular walking.  2. S/P total hysterectomy  3. Menopause present Well on no systemic hormone replacement therapy.  Continue with vitamin D and calcium supplements and regular weightbearing physical activity.  Last bone density was completely normal in March 2017.  Will repeat only at 3 or 4 years.  4. Post-menopausal atrophic vaginitis Well on estradiol cream twice a week.  No contraindication.  Represcribed.  Other orders - estradiol (ESTRACE VAGINAL) 0.1 MG/GM vaginal cream; Place 1.61 Applicatorfuls vaginally 2 (two) times a week. And a thin layer on vulva twice a week as needed  Belinda Fowler, it was a pleasure meeting you today!  I will inform you of your results as soon as they are available.   Health Maintenance for Postmenopausal Women Menopause is a normal process in which your reproductive ability comes to an end. This process happens gradually over a span of months to years, usually between the ages of 73 and 74. Menopause is complete when you have missed 12 consecutive menstrual periods. It is important to talk with your health care provider about some of the most common conditions that affect postmenopausal women, such as heart disease, cancer, and bone loss (osteoporosis). Adopting a healthy lifestyle and getting preventive care can help to promote your health and wellness. Those actions can also lower your chances of developing some of these common conditions. What should I know about menopause? During menopause, you may experience a number of symptoms, such as:  Moderate-to-severe hot flashes.  Night sweats.  Decrease in sex drive.  Mood  swings.  Headaches.  Tiredness.  Irritability.  Memory problems.  Insomnia.  Choosing to treat or not to treat menopausal changes is an individual decision that you make with your health care provider. What should I know about hormone replacement therapy and supplements? Hormone therapy products are effective for treating symptoms that are associated with menopause, such as hot flashes and night sweats. Hormone replacement carries certain risks, especially as you become older. If you are thinking about using estrogen or estrogen with progestin treatments, discuss the benefits and risks with your health care provider. What should I know about heart disease and stroke? Heart disease, heart attack, and stroke become more likely as you age. This may be due, in part, to the hormonal changes that your body experiences during menopause. These can affect how your body processes dietary fats, triglycerides, and cholesterol. Heart attack and stroke are both medical emergencies. There are many things that you can do to help prevent heart disease and stroke:  Have your blood pressure checked at least every 1-2 years. High blood pressure causes heart disease and increases the risk of stroke.  If you are 59-15 years old, ask your health care provider if you should take aspirin to prevent a heart attack or a stroke.  Do not use any tobacco products, including cigarettes, chewing tobacco, or electronic cigarettes. If you need help quitting, ask your health care provider.  It is important to eat a healthy diet and maintain a healthy weight. ? Be sure to include plenty of vegetables, fruits, low-fat dairy products, and lean protein. ? Avoid eating foods that are high in solid fats, added sugars, or salt (sodium).  Get regular exercise. This  is one of the most important things that you can do for your health. ? Try to exercise for at least 150 minutes each week. The type of exercise that you do should  increase your heart rate and make you sweat. This is known as moderate-intensity exercise. ? Try to do strengthening exercises at least twice each week. Do these in addition to the moderate-intensity exercise.  Know your numbers.Ask your health care provider to check your cholesterol and your blood glucose. Continue to have your blood tested as directed by your health care provider.  What should I know about cancer screening? There are several types of cancer. Take the following steps to reduce your risk and to catch any cancer development as early as possible. Breast Cancer  Practice breast self-awareness. ? This means understanding how your breasts normally appear and feel. ? It also means doing regular breast self-exams. Let your health care provider know about any changes, no matter how small.  If you are 29 or older, have a clinician do a breast exam (clinical breast exam or CBE) every year. Depending on your age, family history, and medical history, it may be recommended that you also have a yearly breast X-ray (mammogram).  If you have a family history of breast cancer, talk with your health care provider about genetic screening.  If you are at high risk for breast cancer, talk with your health care provider about having an MRI and a mammogram every year.  Breast cancer (BRCA) gene test is recommended for women who have family members with BRCA-related cancers. Results of the assessment will determine the need for genetic counseling and BRCA1 and for BRCA2 testing. BRCA-related cancers include these types: ? Breast. This occurs in males or females. ? Ovarian. ? Tubal. This may also be called fallopian tube cancer. ? Cancer of the abdominal or pelvic lining (peritoneal cancer). ? Prostate. ? Pancreatic.  Cervical, Uterine, and Ovarian Cancer Your health care provider may recommend that you be screened regularly for cancer of the pelvic organs. These include your ovaries, uterus,  and vagina. This screening involves a pelvic exam, which includes checking for microscopic changes to the surface of your cervix (Pap test).  For women ages 21-65, health care providers may recommend a pelvic exam and a Pap test every three years. For women ages 39-65, they may recommend the Pap test and pelvic exam, combined with testing for human papilloma virus (HPV), every five years. Some types of HPV increase your risk of cervical cancer. Testing for HPV may also be done on women of any age who have unclear Pap test results.  Other health care providers may not recommend any screening for nonpregnant women who are considered low risk for pelvic cancer and have no symptoms. Ask your health care provider if a screening pelvic exam is right for you.  If you have had past treatment for cervical cancer or a condition that could lead to cancer, you need Pap tests and screening for cancer for at least 20 years after your treatment. If Pap tests have been discontinued for you, your risk factors (such as having a new sexual partner) need to be reassessed to determine if you should start having screenings again. Some women have medical problems that increase the chance of getting cervical cancer. In these cases, your health care provider may recommend that you have screening and Pap tests more often.  If you have a family history of uterine cancer or ovarian cancer, talk with your  health care provider about genetic screening.  If you have vaginal bleeding after reaching menopause, tell your health care provider.  There are currently no reliable tests available to screen for ovarian cancer.  Lung Cancer Lung cancer screening is recommended for adults 25-59 years old who are at high risk for lung cancer because of a history of smoking. A yearly low-dose CT scan of the lungs is recommended if you:  Currently smoke.  Have a history of at least 30 pack-years of smoking and you currently smoke or have quit  within the past 15 years. A pack-year is smoking an average of one pack of cigarettes per day for one year.  Yearly screening should:  Continue until it has been 15 years since you quit.  Stop if you develop a health problem that would prevent you from having lung cancer treatment.  Colorectal Cancer  This type of cancer can be detected and can often be prevented.  Routine colorectal cancer screening usually begins at age 18 and continues through age 76.  If you have risk factors for colon cancer, your health care provider may recommend that you be screened at an earlier age.  If you have a family history of colorectal cancer, talk with your health care provider about genetic screening.  Your health care provider may also recommend using home test kits to check for hidden blood in your stool.  A small camera at the end of a tube can be used to examine your colon directly (sigmoidoscopy or colonoscopy). This is done to check for the earliest forms of colorectal cancer.  Direct examination of the colon should be repeated every 5-10 years until age 22. However, if early forms of precancerous polyps or small growths are found or if you have a family history or genetic risk for colorectal cancer, you may need to be screened more often.  Skin Cancer  Check your skin from head to toe regularly.  Monitor any moles. Be sure to tell your health care provider: ? About any new moles or changes in moles, especially if there is a change in a mole's shape or color. ? If you have a mole that is larger than the size of a pencil eraser.  If any of your family members has a history of skin cancer, especially at a young age, talk with your health care provider about genetic screening.  Always use sunscreen. Apply sunscreen liberally and repeatedly throughout the day.  Whenever you are outside, protect yourself by wearing long sleeves, pants, a wide-brimmed hat, and sunglasses.  What should I know  about osteoporosis? Osteoporosis is a condition in which bone destruction happens more quickly than new bone creation. After menopause, you may be at an increased risk for osteoporosis. To help prevent osteoporosis or the bone fractures that can happen because of osteoporosis, the following is recommended:  If you are 19-46 years old, get at least 1,000 mg of calcium and at least 600 mg of vitamin D per day.  If you are older than age 91 but younger than age 32, get at least 1,200 mg of calcium and at least 600 mg of vitamin D per day.  If you are older than age 70, get at least 1,200 mg of calcium and at least 800 mg of vitamin D per day.  Smoking and excessive alcohol intake increase the risk of osteoporosis. Eat foods that are rich in calcium and vitamin D, and do weight-bearing exercises several times each week as directed  by your health care provider. What should I know about how menopause affects my mental health? Depression may occur at any age, but it is more common as you become older. Common symptoms of depression include:  Low or sad mood.  Changes in sleep patterns.  Changes in appetite or eating patterns.  Feeling an overall lack of motivation or enjoyment of activities that you previously enjoyed.  Frequent crying spells.  Talk with your health care provider if you think that you are experiencing depression. What should I know about immunizations? It is important that you get and maintain your immunizations. These include:  Tetanus, diphtheria, and pertussis (Tdap) booster vaccine.  Influenza every year before the flu season begins.  Pneumonia vaccine.  Shingles vaccine.  Your health care provider may also recommend other immunizations. This information is not intended to replace advice given to you by your health care provider. Make sure you discuss any questions you have with your health care provider. Document Released: 03/22/2005 Document Revised: 08/18/2015  Document Reviewed: 11/01/2014 Elsevier Interactive Patient Education  2018 [employer].

## 2017-04-03 LAB — PAP IG W/ RFLX HPV ASCU

## 2017-04-04 ENCOUNTER — Other Ambulatory Visit: Payer: Self-pay | Admitting: *Deleted

## 2017-04-04 MED ORDER — METRONIDAZOLE 0.75 % VA GEL: 1.0000 | Freq: Every day | VAGINAL | 0 refills | Status: DC

## 2017-04-04 MED FILL — metroNIDAZOLE 0.75 % GEL: 0.75 | 5 days supply | Qty: 70 | Fill #0

## 2017-04-07 ENCOUNTER — Telehealth: Payer: Self-pay | Admitting: *Deleted

## 2017-04-07 DIAGNOSIS — N952 Postmenopausal atrophic vaginitis: Secondary | ICD-10-CM

## 2017-04-07 NOTE — Telephone Encounter (Signed)
Pt was seen on 03/31/17 and rx for estradiol 0.1mg  was sent to local pharmacy. Pt states she takes estradiol 0.02% vaginal compound twice weekly [facility]. Pt has taken medication in past. Okay to call in to [facility]?

## 2017-04-08 MED ORDER — NONFORMULARY OR COMPOUNDED ITEM: 4 refills | Status: DC

## 2017-04-08 NOTE — Telephone Encounter (Signed)
Yes, thank you.

## 2017-04-08 NOTE — Telephone Encounter (Signed)
Rx called in 

## 2017-04-16 MED FILL — HYDROCHLOROTHIAZIDE 12.5 MG: 12.5 | 90 days supply | Qty: 90 | Fill #1

## 2017-05-01 DIAGNOSIS — H40013 Open angle with borderline findings, low risk, bilateral: Secondary | ICD-10-CM | POA: Diagnosis not present

## 2017-07-24 MED FILL — HYDROCHLOROTHIAZIDE 12.5 MG: 12.5 | 90 days supply | Qty: 90 | Fill #0

## 2017-08-25 DIAGNOSIS — M546 Pain in thoracic spine: Secondary | ICD-10-CM | POA: Diagnosis not present

## 2017-08-25 DIAGNOSIS — E7800 Pure hypercholesterolemia, unspecified: Secondary | ICD-10-CM | POA: Diagnosis not present

## 2017-08-25 DIAGNOSIS — I10 Essential (primary) hypertension: Secondary | ICD-10-CM | POA: Diagnosis not present

## 2017-08-25 DIAGNOSIS — N951 Menopausal and female climacteric states: Secondary | ICD-10-CM | POA: Diagnosis not present

## 2017-08-27 MED FILL — CYCLOBENZAPRINE 5 MG TABLET: 5 | 10 days supply | Qty: 30 | Fill #1

## 2017-10-23 MED FILL — HYDROCHLOROTHIAZIDE 12.5 MG: 12.5 | 90 days supply | Qty: 90 | Fill #0

## 2017-12-02 MED FILL — CYCLOBENZAPRINE 5 MG TABLET: 5 | 10 days supply | Qty: 30 | Fill #0

## 2018-01-22 MED FILL — HYDROCHLOROTHIAZIDE 12.5 MG: 12.5 | 90 days supply | Qty: 90 | Fill #1

## 2018-02-06 ENCOUNTER — Other Ambulatory Visit: Payer: Self-pay | Admitting: Obstetrics & Gynecology

## 2018-02-06 DIAGNOSIS — Z1231 Encounter for screening mammogram for malignant neoplasm of breast: Secondary | ICD-10-CM

## 2018-02-18 DIAGNOSIS — E7800 Pure hypercholesterolemia, unspecified: Secondary | ICD-10-CM | POA: Diagnosis not present

## 2018-02-18 DIAGNOSIS — M546 Pain in thoracic spine: Secondary | ICD-10-CM | POA: Diagnosis not present

## 2018-02-18 DIAGNOSIS — I10 Essential (primary) hypertension: Secondary | ICD-10-CM | POA: Diagnosis not present

## 2018-02-18 DIAGNOSIS — N951 Menopausal and female climacteric states: Secondary | ICD-10-CM | POA: Diagnosis not present

## 2018-03-17 MED FILL — CYCLOBENZAPRINE 5 MG TABLET: 5 | 10 days supply | Qty: 30 | Fill #0

## 2018-03-26 ENCOUNTER — Ambulatory Visit
Admission: RE | Admit: 2018-03-26 | Discharge: 2018-03-26 | Disposition: A | Discharge status: Home / Self Care | Payer: 59 | Source: Ambulatory Visit | Attending: Obstetrics & Gynecology | Admitting: Obstetrics & Gynecology

## 2018-03-26 DIAGNOSIS — Z1231 Encounter for screening mammogram for malignant neoplasm of breast: Secondary | ICD-10-CM

## 2018-04-21 ENCOUNTER — Encounter: Payer: Self-pay | Admitting: Obstetrics & Gynecology

## 2018-04-21 ENCOUNTER — Ambulatory Visit (INDEPENDENT_AMBULATORY_CARE_PROVIDER_SITE_OTHER): Payer: 59 | Admitting: Obstetrics & Gynecology

## 2018-04-21 VITALS — BP 124/76 | Ht 65.0 in | Wt 180.0 lb

## 2018-04-21 DIAGNOSIS — N952 Postmenopausal atrophic vaginitis: Secondary | ICD-10-CM

## 2018-04-21 DIAGNOSIS — Z01419 Encounter for gynecological examination (general) (routine) without abnormal findings: Secondary | ICD-10-CM

## 2018-04-21 DIAGNOSIS — Z780 Asymptomatic menopausal state: Secondary | ICD-10-CM | POA: Diagnosis not present

## 2018-04-21 DIAGNOSIS — Z90710 Acquired absence of both cervix and uterus: Secondary | ICD-10-CM

## 2018-04-21 DIAGNOSIS — E663 Overweight: Secondary | ICD-10-CM | POA: Diagnosis not present

## 2018-04-21 NOTE — Progress Notes (Signed)
Belinda Fowler [birth year] [id number]   History:    63 y.o. G2P1A1L1  Married.  Nurse tech.  Daughter, nurse [employer].  RP:  Established patient presenting for annual gyn exam   HPI: S/P Total Hysterectomy.  Menopause, well on no systemic hormone replacement therapy.  Using estradiol cream vaginally.  Will send prescription for estradiol (1 mL)  1/4 syringe twice weekly (0.2 mg/ml) cream, [facility].  No pelvic pain.  No pain with intercourse.  Breast normal.  Urine and bowel movements normal.  Body mass index 29.95.  Walks regularly.  Health labs with family physician.  Past medical history,surgical history, family history and social history were all reviewed and documented in the EPIC chart.  Gynecologic History Patient's last menstrual period was 01/06/1990. Contraception: status post hysterectomy Last Pap: 03/2017. Results were: Negative Last mammogram: 03/2018. Results were: Negative Bone Density: 2017 normal.  Repeat at 5 years. Colonoscopy: 2016  Obstetric History OB History  Gravida Para Term Preterm AB Living  2 1 1   1 1   SAB TAB Ectopic Multiple Live Births  1       1    # Outcome Date GA Lbr Len/2nd Weight Sex Delivery Anes PTL Lv  2 SAB           1 Term     F Vag-Spont  N LIV     ROS: A ROS was performed and pertinent positives and negatives are included in the history.  GENERAL: No fevers or chills. HEENT: No change in vision, no earache, sore throat or sinus congestion. NECK: No pain or stiffness. CARDIOVASCULAR: No chest pain or pressure. No palpitations. PULMONARY: No shortness of breath, cough or wheeze. GASTROINTESTINAL: No abdominal pain, nausea, vomiting or diarrhea, melena or bright red blood per rectum. GENITOURINARY: No urinary frequency, urgency, hesitancy or dysuria. MUSCULOSKELETAL: No joint or muscle pain, no back pain, no recent trauma. DERMATOLOGIC: No rash, no itching, no lesions. ENDOCRINE: No polyuria, polydipsia, no heat or cold  intolerance. No recent change in weight. HEMATOLOGICAL: No anemia or easy bruising or bleeding. NEUROLOGIC: No headache, seizures, numbness, tingling or weakness. PSYCHIATRIC: No depression, no loss of interest in normal activity or change in sleep pattern.     Exam:   BP 124/76   Ht 5\' 5"  (1.651 m)   Wt 180 lb (81.6 kg)   LMP 01/06/1990   BMI 29.95 kg/m   Body mass index is 29.95 kg/m.  General appearance : Well developed well nourished female. No acute distress HEENT: Eyes: no retinal hemorrhage or exudates,  Neck supple, trachea midline, no carotid bruits, no thyroidmegaly Lungs: Clear to auscultation, no rhonchi or wheezes, or rib retractions  Heart: Regular rate and rhythm, no murmurs or gallops Breast:Examined in sitting and supine position were symmetrical in appearance, no palpable masses or tenderness,  no skin retraction, no nipple inversion, no nipple discharge, no skin discoloration, no axillary or supraclavicular lymphadenopathy Abdomen: no palpable masses or tenderness, no rebound or guarding Extremities: no edema or skin discoloration or tenderness  Pelvic: Vulva: Normal             Vagina: No gross lesions or discharge  Cervix/Uterus absent  Adnexa  Without masses or tenderness  Anus: Normal   Assessment/Plan:  63 y.o. female for annual exam   1. Well female exam with routine gynecological exam Gynecologic exam status post total hysterectomy and menopause.  Pap test in February 2019 was negative, no indication to repeat this year.  Breast exam normal.  Screening mammogram February 2020 was negative.  Colonoscopy in 2016.  Health labs with family physician.  2. S/P total hysterectomy  3. Postmenopausal Well on no systemic hormone replacement therapy.  Last bone density in 2017 was normal, will repeat at 5 years.  Vitamin D supplements, calcium intake of 1.2 to 1.5 g/day and regular weightbearing physical activities recommended.  4. Post-menopausal atrophic  vaginitis Well on compounded estrogen cream.  Message sent to re-prescribe and continue to use a quarter of an applicator twice a week.  5. Overweight (BMI 25.0-29.9) Recommend a lower calorie/carb diet such as [employer] and aerobic activities 5 times a week with weightlifting every 2 days.  Princess Bruins MD, 8:57 AM 04/21/2018

## 2018-04-21 NOTE — Patient Instructions (Signed)
1. Well female exam with routine gynecological exam Gynecologic exam status post total hysterectomy and menopause.  Pap test in February 2019 was negative, no indication to repeat this year.  Breast exam normal.  Screening mammogram February 2020 was negative.  Colonoscopy in 2016.  Health labs with family physician.  2. S/P total hysterectomy  3. Postmenopausal Well on no systemic hormone replacement therapy.  Last bone density in 2017 was normal, will repeat at 5 years.  Vitamin D supplements, calcium intake of 1.2 to 1.5 g/day and regular weightbearing physical activities recommended.  4. Post-menopausal atrophic vaginitis Well on compounded estrogen cream.  Message sent to re-prescribe and continue to use a quarter of an applicator twice a week.  5. Overweight (BMI 25.0-29.9) Recommend a lower calorie/carb diet such as [employer] and aerobic activities 5 times a week with weightlifting every 2 days.  Belinda Fowler, it was a pleasure seeing you today!

## 2018-04-23 ENCOUNTER — Telehealth: Payer: Self-pay | Admitting: *Deleted

## 2018-04-23 DIAGNOSIS — N952 Postmenopausal atrophic vaginitis: Secondary | ICD-10-CM

## 2018-04-23 MED ORDER — NONFORMULARY OR COMPOUNDED ITEM: 3 refills | Status: DC

## 2018-04-23 NOTE — Telephone Encounter (Signed)
Rx called in 

## 2018-04-23 NOTE — Telephone Encounter (Signed)
-----   Message from Princess Bruins, MD sent at 04/21/2018  9:25 AM EDT ----- Regarding: Coumpound Estradiol cream prescription  Estradiol (1 mL)  1/4 syringe twice weekly (0.2 mg/ml) cream, refill for the year, at [place].

## 2018-05-06 MED FILL — HYDROCHLOROTHIAZIDE 12.5 MG: 12.5 | 90 days supply | Qty: 90 | Fill #0

## 2018-07-24 MED FILL — CYCLOBENZAPRINE HCL 5 MG TA: 5 | 10 days supply | Qty: 30 | Fill #0

## 2018-08-03 MED FILL — HYDROCHLOROTHIAZIDE 12.5 MG: 12.5 | 90 days supply | Qty: 90 | Fill #1

## 2018-08-18 DIAGNOSIS — I10 Essential (primary) hypertension: Secondary | ICD-10-CM | POA: Diagnosis not present

## 2018-08-18 DIAGNOSIS — M546 Pain in thoracic spine: Secondary | ICD-10-CM | POA: Diagnosis not present

## 2018-08-18 DIAGNOSIS — N951 Menopausal and female climacteric states: Secondary | ICD-10-CM | POA: Diagnosis not present

## 2018-08-18 DIAGNOSIS — E7800 Pure hypercholesterolemia, unspecified: Secondary | ICD-10-CM | POA: Diagnosis not present

## 2018-08-24 DIAGNOSIS — I10 Essential (primary) hypertension: Secondary | ICD-10-CM | POA: Diagnosis not present

## 2018-10-20 MED FILL — CYCLOBENZAPRINE 5 MG TABLET: 5 | 10 days supply | Qty: 30 | Fill #0

## 2018-11-03 MED FILL — HYDROCHLOROTHIAZIDE 12.5 MG: 12.5 | 90 days supply | Qty: 90 | Fill #0

## 2019-02-08 MED FILL — HYDROCHLOROTHIAZIDE 12.5 MG: 12.5 | 90 days supply | Qty: 90 | Fill #1

## 2019-02-11 ENCOUNTER — Other Ambulatory Visit: Payer: Self-pay | Admitting: Obstetrics & Gynecology

## 2019-02-11 DIAGNOSIS — Z9289 Personal history of other medical treatment: Secondary | ICD-10-CM

## 2019-02-18 DIAGNOSIS — I10 Essential (primary) hypertension: Secondary | ICD-10-CM | POA: Diagnosis not present

## 2019-02-18 DIAGNOSIS — E7800 Pure hypercholesterolemia, unspecified: Secondary | ICD-10-CM | POA: Diagnosis not present

## 2019-02-18 DIAGNOSIS — N951 Menopausal and female climacteric states: Secondary | ICD-10-CM | POA: Diagnosis not present

## 2019-02-18 DIAGNOSIS — Z658 Other specified problems related to psychosocial circumstances: Secondary | ICD-10-CM | POA: Diagnosis not present

## 2019-02-18 DIAGNOSIS — M546 Pain in thoracic spine: Secondary | ICD-10-CM | POA: Diagnosis not present

## 2019-02-18 DIAGNOSIS — H6120 Impacted cerumen, unspecified ear: Secondary | ICD-10-CM | POA: Diagnosis not present

## 2019-02-18 MED FILL — ALPRAZolam 0.25 MG TABS: 0.25 | 10 days supply | Qty: 30 | Fill #0

## 2019-03-29 ENCOUNTER — Other Ambulatory Visit: Payer: Self-pay

## 2019-03-29 ENCOUNTER — Ambulatory Visit
Admission: RE | Admit: 2019-03-29 | Discharge: 2019-03-29 | Disposition: A | Discharge status: Home / Self Care | Payer: PRIVATE HEALTH INSURANCE | Source: Ambulatory Visit | Attending: Obstetrics & Gynecology | Admitting: Obstetrics & Gynecology

## 2019-03-29 DIAGNOSIS — Z9289 Personal history of other medical treatment: Secondary | ICD-10-CM

## 2019-03-29 DIAGNOSIS — Z1231 Encounter for screening mammogram for malignant neoplasm of breast: Secondary | ICD-10-CM | POA: Diagnosis not present

## 2019-04-08 MED FILL — CYCLOBENZAPRINE HCL 5 MG TA: 5 | 10 days supply | Qty: 30 | Fill #0

## 2019-04-29 DIAGNOSIS — H524 Presbyopia: Secondary | ICD-10-CM | POA: Diagnosis not present

## 2019-05-10 MED FILL — HYDROCHLOROTHIAZIDE 12.5 MG: 12.5 | 90 days supply | Qty: 90 | Fill #2

## 2019-05-11 ENCOUNTER — Other Ambulatory Visit: Payer: Self-pay

## 2019-05-12 ENCOUNTER — Ambulatory Visit (INDEPENDENT_AMBULATORY_CARE_PROVIDER_SITE_OTHER): Payer: 59 | Admitting: Obstetrics & Gynecology

## 2019-05-12 ENCOUNTER — Encounter: Payer: Self-pay | Admitting: Obstetrics & Gynecology

## 2019-05-12 VITALS — BP 128/86 | Ht 65.0 in | Wt 178.0 lb

## 2019-05-12 DIAGNOSIS — N952 Postmenopausal atrophic vaginitis: Secondary | ICD-10-CM | POA: Diagnosis not present

## 2019-05-12 DIAGNOSIS — Z780 Asymptomatic menopausal state: Secondary | ICD-10-CM

## 2019-05-12 DIAGNOSIS — Z90710 Acquired absence of both cervix and uterus: Secondary | ICD-10-CM | POA: Diagnosis not present

## 2019-05-12 DIAGNOSIS — Z01419 Encounter for gynecological examination (general) (routine) without abnormal findings: Secondary | ICD-10-CM

## 2019-05-12 NOTE — Patient Instructions (Signed)
1. Well female exam with routine gynecological exam Gynecologic exam s/p Total Hysterectomy with RSO in menopause.  No indication to repeat a Pap test this year.  Breasts normal.  Screening mammogram February 2021 was negative.  Colonoscopy scheduled for July 2021.  Health labs with family physician.  Body mass index stable at 29.62.  Continue with fitness and healthy nutrition.  2. Postmenopausal Well on no systemic HRT.  BD normal in 2017.  3. S/P total hysterectomy with RSO  4. Post-menopausal atrophic vaginitis  No CI to Estradiol cream.  Will continue on estradiol (1 mL)  1/4 syringe twice weekly (0.2 mg/ml) cream, [facility].  Belinda Fowler, it was a pleasure seeing you today!

## 2019-05-12 NOTE — Progress Notes (Signed)
Belinda Fowler 1955-09-12 [id number]   History:    64 y.o. 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  RP:  Established patient presenting for annual gyn exam   HPI: S/P Total Hysterectomy with RSO.  Menopause, well on no systemic hormone replacement therapy.  Using estradiol cream vaginally.  Will send prescription for estradiol (1 mL)  1/4 syringe twice weekly (0.2 mg/ml) cream, [facility].  No pelvic pain.  No pain with intercourse.  Breast normal.   Mammo 03/2019 Negative.  Urine and bowel movements normal.  Body mass index 29.62.  Walks regularly.  Health labs with family physician. Colono scheduled in 08/2019.   Past medical history,surgical history, family history and social history were all reviewed and documented in the EPIC chart.  Gynecologic History Patient's last menstrual period was 01/06/1990.  Obstetric History OB History  Gravida Para Term Preterm AB Living  2 1 1   1 1   SAB TAB Ectopic Multiple Live Births  1       1    # Outcome Date GA Lbr Len/2nd Weight Sex Delivery Anes PTL Lv  2 SAB           1 Term     F Vag-Spont  N LIV     ROS: A ROS was performed and pertinent positives and negatives are included in the history.  GENERAL: No fevers or chills. HEENT: No change in vision, no earache, sore throat or sinus congestion. NECK: No pain or stiffness. CARDIOVASCULAR: No chest pain or pressure. No palpitations. PULMONARY: No shortness of breath, cough or wheeze. GASTROINTESTINAL: No abdominal pain, nausea, vomiting or diarrhea, melena or bright red blood per rectum. GENITOURINARY: No urinary frequency, urgency, hesitancy or dysuria. MUSCULOSKELETAL: No joint or muscle pain, no back pain, no recent trauma. DERMATOLOGIC: No rash, no itching, no lesions. ENDOCRINE: No polyuria, polydipsia, no heat or cold intolerance. No recent change in weight. HEMATOLOGICAL: No anemia or easy bruising or bleeding. NEUROLOGIC: No headache, seizures,  numbness, tingling or weakness. PSYCHIATRIC: No depression, no loss of interest in normal activity or change in sleep pattern.     Exam:   BP 128/86   Ht 5\' 5"  (1.651 m)   Wt 178 lb (80.7 kg)   LMP 01/06/1990   BMI 29.62 kg/m   Body mass index is 29.62 kg/m.  General appearance : Well developed well nourished female. No acute distress HEENT: Eyes: no retinal hemorrhage or exudates,  Neck supple, trachea midline, no carotid bruits, no thyroidmegaly Lungs: Clear to auscultation, no rhonchi or wheezes, or rib retractions  Heart: Regular rate and rhythm, no murmurs or gallops Breast:Examined in sitting and supine position were symmetrical in appearance, no palpable masses or tenderness,  no skin retraction, no nipple inversion, no nipple discharge, no skin discoloration, no axillary or supraclavicular lymphadenopathy Abdomen: no palpable masses or tenderness, no rebound or guarding Extremities: no edema or skin discoloration or tenderness  Pelvic: Vulva: Normal             Vagina: No gross lesions or discharge  Cervix/Uterus absent  Adnexa  Without masses or tenderness  Anus: Normal   Assessment/Plan:  64 y.o. female for annual exam   1. Well female exam with routine gynecological exam Gynecologic exam s/p Total Hysterectomy with RSO in menopause.  No indication to repeat a Pap test this year.  Breasts normal.  Screening mammogram February 2021 was negative.  Colonoscopy scheduled for July 2021.  Health labs  with family physician.  Body mass index stable at 29.62.  Continue with fitness and healthy nutrition.  2. Postmenopausal Well on no systemic HRT.  BD normal in 2017.  3. S/P total hysterectomy with RSO  4. Post-menopausal atrophic vaginitis  No CI to Estradiol cream.  Will continue on estradiol (1 mL)  1/4 syringe twice weekly (0.2 mg/ml) cream, [facility].  Princess Bruins MD, 9:02 AM 05/12/2019

## 2019-06-07 ENCOUNTER — Other Ambulatory Visit: Payer: Self-pay

## 2019-06-07 DIAGNOSIS — N952 Postmenopausal atrophic vaginitis: Secondary | ICD-10-CM

## 2019-06-07 MED ORDER — NONFORMULARY OR COMPOUNDED ITEM
3 refills | Status: DC
Start: 2019-06-07 — End: 2020-05-16

## 2019-06-07 NOTE — Telephone Encounter (Signed)
Pharmacist said they will do 1/4 appful for patient and let her try it and see how it works for her. Will let us know if she needs more than that.

## 2019-06-07 NOTE — Telephone Encounter (Signed)
Per your office note you prescribed compound estradiol vaginal cream "1/4 syringe twice weekly (0.2 mg/ml) ". Previously patient was using the standard directions with 1 ml prefilled syringe twice weekly.  Pharmacy is facing challenge to create a 1/4 appful syringe for her and before they try to figure it out pharmacist asked me to check with you to conform your directions.

## 2019-06-07 NOTE — Telephone Encounter (Signed)
Please continue prescription as before, just tell patient to try 1/4 of an applicator twice a week to see if that would be enough to control her symptoms.

## 2019-08-19 DIAGNOSIS — M546 Pain in thoracic spine: Secondary | ICD-10-CM | POA: Diagnosis not present

## 2019-08-19 DIAGNOSIS — Z1211 Encounter for screening for malignant neoplasm of colon: Secondary | ICD-10-CM | POA: Diagnosis not present

## 2019-08-19 DIAGNOSIS — E7800 Pure hypercholesterolemia, unspecified: Secondary | ICD-10-CM | POA: Diagnosis not present

## 2019-08-19 DIAGNOSIS — K5900 Constipation, unspecified: Secondary | ICD-10-CM | POA: Diagnosis not present

## 2019-08-19 DIAGNOSIS — K625 Hemorrhage of anus and rectum: Secondary | ICD-10-CM | POA: Diagnosis not present

## 2019-08-19 DIAGNOSIS — N951 Menopausal and female climacteric states: Secondary | ICD-10-CM | POA: Diagnosis not present

## 2019-08-19 DIAGNOSIS — Z86010 Personal history of colonic polyps: Secondary | ICD-10-CM | POA: Diagnosis not present

## 2019-08-19 DIAGNOSIS — I10 Essential (primary) hypertension: Secondary | ICD-10-CM | POA: Diagnosis not present

## 2019-08-24 ENCOUNTER — Other Ambulatory Visit (HOSPITAL_COMMUNITY): Payer: Self-pay | Admitting: Family Medicine

## 2019-08-24 MED FILL — HYDROCHLOROTHIAZIDE 12.5 MG: 12.5 | 90 days supply | Qty: 90 | Fill #0

## 2019-08-25 MED FILL — CLENPIQ 10-3.5-12 MG-GM -GM: 10-3.5-12 M | 1 days supply | Qty: 320 | Fill #0

## 2019-09-06 DIAGNOSIS — Z1211 Encounter for screening for malignant neoplasm of colon: Secondary | ICD-10-CM | POA: Diagnosis not present

## 2019-09-06 DIAGNOSIS — D122 Benign neoplasm of ascending colon: Secondary | ICD-10-CM | POA: Diagnosis not present

## 2019-09-06 DIAGNOSIS — K625 Hemorrhage of anus and rectum: Secondary | ICD-10-CM | POA: Diagnosis not present

## 2019-09-06 DIAGNOSIS — K635 Polyp of colon: Secondary | ICD-10-CM | POA: Diagnosis not present

## 2019-09-20 ENCOUNTER — Telehealth: Payer: Self-pay

## 2019-09-20 MED FILL — CYCLOBENZAPRINE HCL 5 MG TA: 5 | 10 days supply | Qty: 30 | Fill #1

## 2019-09-20 NOTE — Telephone Encounter (Signed)
Patient called because last time she received her Estradiol cream it was in a dial pack that she turned and put it into applicator. She does not like this and would like to go back to having individual applicator for each use.  I called and spoke with Lauren at [facility] and she said that will be no problem. She will call patient first if there is a big price increase to confirm with her.

## 2019-11-26 MED FILL — HYDROCHLOROTHIAZIDE 12.5 MG: 12.5 | 90 days supply | Qty: 90 | Fill #1

## 2020-02-24 ENCOUNTER — Other Ambulatory Visit: Payer: Self-pay | Admitting: Obstetrics & Gynecology

## 2020-02-24 DIAGNOSIS — Z1231 Encounter for screening mammogram for malignant neoplasm of breast: Secondary | ICD-10-CM

## 2020-03-03 ENCOUNTER — Other Ambulatory Visit (HOSPITAL_COMMUNITY): Payer: Self-pay | Admitting: Family Medicine

## 2020-03-03 DIAGNOSIS — I10 Essential (primary) hypertension: Secondary | ICD-10-CM | POA: Diagnosis not present

## 2020-03-03 DIAGNOSIS — E7800 Pure hypercholesterolemia, unspecified: Secondary | ICD-10-CM | POA: Diagnosis not present

## 2020-03-03 DIAGNOSIS — M546 Pain in thoracic spine: Secondary | ICD-10-CM | POA: Diagnosis not present

## 2020-03-03 DIAGNOSIS — Z658 Other specified problems related to psychosocial circumstances: Secondary | ICD-10-CM | POA: Diagnosis not present

## 2020-03-03 DIAGNOSIS — N951 Menopausal and female climacteric states: Secondary | ICD-10-CM | POA: Diagnosis not present

## 2020-03-03 MED FILL — CYCLOBENZAPRINE HCL 5 MG TA: 5 | 10 days supply | Qty: 30 | Fill #0

## 2020-03-03 MED FILL — ALPRAZolam 0.25 MG TABS: 0.25 | 10 days supply | Qty: 30 | Fill #0

## 2020-03-03 MED FILL — HYDROCHLOROTHIAZIDE 12.5 MG: 12.5 | 90 days supply | Qty: 90 | Fill #2

## 2020-04-06 ENCOUNTER — Ambulatory Visit
Admission: RE | Admit: 2020-04-06 | Discharge: 2020-04-06 | Disposition: A | Discharge status: Home / Self Care | Payer: PRIVATE HEALTH INSURANCE | Source: Ambulatory Visit | Attending: Obstetrics & Gynecology | Admitting: Obstetrics & Gynecology

## 2020-04-06 ENCOUNTER — Other Ambulatory Visit: Payer: Self-pay

## 2020-04-06 ENCOUNTER — Ambulatory Visit: Payer: PRIVATE HEALTH INSURANCE

## 2020-04-06 DIAGNOSIS — Z1231 Encounter for screening mammogram for malignant neoplasm of breast: Secondary | ICD-10-CM

## 2020-05-12 ENCOUNTER — Ambulatory Visit (INDEPENDENT_AMBULATORY_CARE_PROVIDER_SITE_OTHER): Payer: 59 | Admitting: Obstetrics & Gynecology

## 2020-05-12 ENCOUNTER — Other Ambulatory Visit: Payer: Self-pay

## 2020-05-12 ENCOUNTER — Encounter: Payer: Self-pay | Admitting: Obstetrics & Gynecology

## 2020-05-12 VITALS — BP 130/80 | Ht 64.5 in | Wt 181.0 lb

## 2020-05-12 DIAGNOSIS — Z01419 Encounter for gynecological examination (general) (routine) without abnormal findings: Secondary | ICD-10-CM

## 2020-05-12 DIAGNOSIS — N952 Postmenopausal atrophic vaginitis: Secondary | ICD-10-CM | POA: Diagnosis not present

## 2020-05-12 DIAGNOSIS — Z1272 Encounter for screening for malignant neoplasm of vagina: Secondary | ICD-10-CM | POA: Diagnosis not present

## 2020-05-12 DIAGNOSIS — E6609 Other obesity due to excess calories: Secondary | ICD-10-CM

## 2020-05-12 DIAGNOSIS — Z780 Asymptomatic menopausal state: Secondary | ICD-10-CM

## 2020-05-12 DIAGNOSIS — Z6830 Body mass index (BMI) 30.0-30.9, adult: Secondary | ICD-10-CM | POA: Diagnosis not present

## 2020-05-12 DIAGNOSIS — Z90710 Acquired absence of both cervix and uterus: Secondary | ICD-10-CM

## 2020-05-12 NOTE — Progress Notes (Signed)
Belinda Fowler 11/04/1955 [id number]   History:    65 y.o.  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  [id number] presenting for annual gyn exam   HPI:S/P Total Hysterectomy with RSO.Menopause, well on no systemic hormone replacement therapy. Using estradiol cream vaginally. Will send prescription for estradiol (1 mL) 1/4 syringe twice weekly (0.2 mg/ml) cream, [facility].No pelvic pain. No pain with intercourse. Breast normal.  Mammo 03/2020 Negative.  Urine and bowel movements normal. Body mass index 30.59. Walks regularly. Health labs with family physician. Colono 08/2019.   Past medical history,surgical history, family history and social history were all reviewed and documented in the EPIC chart.  Gynecologic History Patient's last menstrual period was 01/06/1990.  Obstetric History OB History  Gravida Para Term Preterm AB Living  2 1 1   1 1   SAB IAB Ectopic Multiple Live Births  1       1    # Outcome Date GA Lbr Len/2nd Weight Sex Delivery Anes PTL Lv  2 SAB           1 Term     F Vag-Spont  N LIV     ROS: A ROS was performed and pertinent positives and negatives are included in the history.  GENERAL: No fevers or chills. HEENT: No change in vision, no earache, sore throat or sinus congestion. NECK: No pain or stiffness. CARDIOVASCULAR: No chest pain or pressure. No palpitations. PULMONARY: No shortness of breath, cough or wheeze. GASTROINTESTINAL: No abdominal pain, nausea, vomiting or diarrhea, melena or bright red blood per rectum. GENITOURINARY: No urinary frequency, urgency, hesitancy or dysuria. MUSCULOSKELETAL: No joint or muscle pain, no back pain, no recent trauma. DERMATOLOGIC: No rash, no itching, no lesions. ENDOCRINE: No polyuria, polydipsia, no heat or cold intolerance. No recent change in weight. HEMATOLOGICAL: No anemia or easy bruising or bleeding. NEUROLOGIC: No headache, seizures, numbness, tingling  or weakness. PSYCHIATRIC: No depression, no loss of interest in normal activity or change in sleep pattern.     Exam:   BP 130/80   Ht 5' 4.5" (1.638 m)   Wt 181 lb (82.1 kg)   LMP 01/06/1990   BMI 30.59 kg/m   Body mass index is 30.59 kg/m.  General appearance : Well developed well nourished female. No acute distress HEENT: Eyes: no retinal hemorrhage or exudates,  Neck supple, trachea midline, no carotid bruits, no thyroidmegaly Lungs: Clear to auscultation, no rhonchi or wheezes, or rib retractions  Heart: Regular rate and rhythm, no murmurs or gallops Breast:Examined in sitting and supine position were symmetrical in appearance, no palpable masses or tenderness,  no skin retraction, no nipple inversion, no nipple discharge, no skin discoloration, no axillary or supraclavicular lymphadenopathy Abdomen: no palpable masses or tenderness, no rebound or guarding Extremities: no edema or skin discoloration or tenderness  Pelvic: Vulva: Normal             Vagina: No gross lesions or discharge.  Pap reflex done.  Cervix/Uterus absent  Adnexa  Without masses or tenderness  Anus: Normal   Assessment/Plan:  65 y.o. female for annual exam   1. Encounter for Papanicolaou smear of vagina as part of routine gynecological examination Gynecologic exam status post total hysterectomy with RSO in menopause.  Reflex done on the vaginal vault.  Breast exam normal.  Screening mammogram - February 2022.  Colonoscopy July 2021.  Health labs with family physician.  2. S/P total hysterectomy with RSO  3. Postmenopausal Well on no  systemic hormone replacement therapy.  Bone density completely normal in 2017, will repeat at age 10.  66. Post-menopausal atrophic vaginitis Well on compound estrogen cream.  No contraindication to continue.  We will keep same usage.  Message sent to call [facility].  5. Class 1 obesity due to excess calories with serious comorbidity and  body mass index (BMI) of 30.0 to 30.9 in adult Low calorie/carb diet.  Aerobic activities 5 times a week and light weightlifting every 2 days.  Princess Bruins MD, 9:50 AM 05/12/2020

## 2020-05-12 NOTE — Addendum Note (Signed)
Addended by: Thurnell Garbe A on: 05/12/2020 02:36 PM   Modules accepted: Orders

## 2020-05-16 ENCOUNTER — Telehealth: Payer: Self-pay | Admitting: *Deleted

## 2020-05-16 DIAGNOSIS — N952 Postmenopausal atrophic vaginitis: Secondary | ICD-10-CM

## 2020-05-16 LAB — PAP IG W/ RFLX HPV ASCU

## 2020-05-16 MED ORDER — NONFORMULARY OR COMPOUNDED ITEM: 3 refills | Status: DC

## 2020-05-16 NOTE — Telephone Encounter (Signed)
-----   Message from Princess Bruins, MD sent at 05/12/2020 10:05 AM EDT ----- Regarding: Compound Estrogen Cream Estradiol (1 mL) 1/4 syringe twice weekly (0.2 mg/ml) cream, [facility]. Refill x 1 year.

## 2020-05-16 NOTE — Telephone Encounter (Signed)
Rx called in 

## 2020-06-21 ENCOUNTER — Other Ambulatory Visit (HOSPITAL_COMMUNITY): Payer: Self-pay

## 2020-06-21 MED FILL — Hydrochlorothiazide Cap 12.5 MG: ORAL | 90 days supply | Qty: 90 | Fill #0 | Status: AC

## 2020-07-01 DIAGNOSIS — H524 Presbyopia: Secondary | ICD-10-CM | POA: Diagnosis not present

## 2020-08-31 DIAGNOSIS — Z658 Other specified problems related to psychosocial circumstances: Secondary | ICD-10-CM | POA: Diagnosis not present

## 2020-08-31 DIAGNOSIS — I10 Essential (primary) hypertension: Secondary | ICD-10-CM | POA: Diagnosis not present

## 2020-08-31 DIAGNOSIS — N951 Menopausal and female climacteric states: Secondary | ICD-10-CM | POA: Diagnosis not present

## 2020-08-31 DIAGNOSIS — M546 Pain in thoracic spine: Secondary | ICD-10-CM | POA: Diagnosis not present

## 2020-08-31 DIAGNOSIS — E7800 Pure hypercholesterolemia, unspecified: Secondary | ICD-10-CM | POA: Diagnosis not present

## 2020-10-02 ENCOUNTER — Other Ambulatory Visit (HOSPITAL_COMMUNITY): Payer: Self-pay

## 2020-10-03 ENCOUNTER — Other Ambulatory Visit (HOSPITAL_COMMUNITY): Payer: Self-pay

## 2020-10-03 MED ORDER — HYDROCHLOROTHIAZIDE 12.5 MG PO CAPS
12.5000 mg | ORAL_CAPSULE | Freq: Every morning | ORAL | 3 refills | Status: DC
Start: 2020-10-03 — End: 2021-05-14
  Filled 2020-10-03: qty 90, 90d supply, fill #0
  Filled 2021-01-09: qty 90, 90d supply, fill #1
  Filled 2021-04-09: qty 90, 90d supply, fill #2

## 2020-11-13 ENCOUNTER — Other Ambulatory Visit (HOSPITAL_COMMUNITY): Payer: Self-pay

## 2020-11-13 MED FILL — Cyclobenzaprine HCl Tab 5 MG: ORAL | 10 days supply | Qty: 30 | Fill #0 | Status: AC

## 2021-01-09 ENCOUNTER — Other Ambulatory Visit (HOSPITAL_COMMUNITY): Payer: Self-pay

## 2021-02-27 ENCOUNTER — Other Ambulatory Visit: Payer: Self-pay | Admitting: Obstetrics & Gynecology

## 2021-02-27 DIAGNOSIS — Z1231 Encounter for screening mammogram for malignant neoplasm of breast: Secondary | ICD-10-CM

## 2021-03-05 ENCOUNTER — Other Ambulatory Visit (HOSPITAL_COMMUNITY): Payer: Self-pay

## 2021-03-05 DIAGNOSIS — M25561 Pain in right knee: Secondary | ICD-10-CM | POA: Diagnosis not present

## 2021-03-05 DIAGNOSIS — I10 Essential (primary) hypertension: Secondary | ICD-10-CM | POA: Diagnosis not present

## 2021-03-05 DIAGNOSIS — Z658 Other specified problems related to psychosocial circumstances: Secondary | ICD-10-CM | POA: Diagnosis not present

## 2021-03-05 DIAGNOSIS — E7800 Pure hypercholesterolemia, unspecified: Secondary | ICD-10-CM | POA: Diagnosis not present

## 2021-03-05 MED ORDER — NAPROXEN 500 MG PO TABS
500.0000 mg | ORAL_TABLET | Freq: Three times a day (TID) | ORAL | 1 refills | Status: DC
  Filled 2021-03-05: qty 30, 10d supply, fill #0
  Filled 2021-03-23: qty 30, 10d supply, fill #1

## 2021-03-23 ENCOUNTER — Other Ambulatory Visit (HOSPITAL_COMMUNITY): Payer: Self-pay

## 2021-04-09 ENCOUNTER — Other Ambulatory Visit (HOSPITAL_COMMUNITY): Payer: Self-pay

## 2021-04-12 ENCOUNTER — Ambulatory Visit
Admission: RE | Admit: 2021-04-12 | Discharge: 2021-04-12 | Disposition: A | Discharge status: Home / Self Care | Payer: 59 | Source: Ambulatory Visit | Attending: Obstetrics & Gynecology | Admitting: Obstetrics & Gynecology

## 2021-04-12 DIAGNOSIS — Z1231 Encounter for screening mammogram for malignant neoplasm of breast: Secondary | ICD-10-CM

## 2021-04-16 ENCOUNTER — Other Ambulatory Visit (HOSPITAL_COMMUNITY): Payer: Self-pay

## 2021-04-16 MED ORDER — CYCLOBENZAPRINE HCL 5 MG PO TABS
5.0000 mg | ORAL_TABLET | Freq: Three times a day (TID) | ORAL | 2 refills | Status: DC | PRN
  Filled 2021-04-16: qty 30, 10d supply, fill #0

## 2021-04-16 MED ORDER — NAPROXEN 500 MG PO TABS
500.0000 mg | ORAL_TABLET | Freq: Three times a day (TID) | ORAL | 1 refills | Status: DC | PRN
Start: 2021-04-16 — End: 2021-06-06
  Filled 2021-04-16: qty 30, 10d supply, fill #0
  Filled 2021-05-11: qty 30, 10d supply, fill #1

## 2021-05-11 ENCOUNTER — Other Ambulatory Visit (HOSPITAL_COMMUNITY): Payer: Self-pay

## 2021-05-14 ENCOUNTER — Ambulatory Visit (INDEPENDENT_AMBULATORY_CARE_PROVIDER_SITE_OTHER): Payer: 59 | Admitting: Obstetrics & Gynecology

## 2021-05-14 ENCOUNTER — Other Ambulatory Visit: Payer: Self-pay

## 2021-05-14 ENCOUNTER — Encounter: Payer: Self-pay | Admitting: Obstetrics & Gynecology

## 2021-05-14 VITALS — BP 110/72 | HR 72 | Resp 16 | Ht 64.5 in | Wt 185.0 lb

## 2021-05-14 DIAGNOSIS — E6609 Other obesity due to excess calories: Secondary | ICD-10-CM | POA: Diagnosis not present

## 2021-05-14 DIAGNOSIS — Z780 Asymptomatic menopausal state: Secondary | ICD-10-CM

## 2021-05-14 DIAGNOSIS — Z90710 Acquired absence of both cervix and uterus: Secondary | ICD-10-CM

## 2021-05-14 DIAGNOSIS — Z6831 Body mass index (BMI) 31.0-31.9, adult: Secondary | ICD-10-CM | POA: Diagnosis not present

## 2021-05-14 DIAGNOSIS — Z01419 Encounter for gynecological examination (general) (routine) without abnormal findings: Secondary | ICD-10-CM

## 2021-05-14 DIAGNOSIS — N952 Postmenopausal atrophic vaginitis: Secondary | ICD-10-CM

## 2021-05-14 MED ORDER — NONFORMULARY OR COMPOUNDED ITEM: Status: DC

## 2021-05-14 NOTE — Telephone Encounter (Signed)
Belinda Bruins, MD  P [facility] Triage ?Please send prescription o Estradiol (1 mL)  1/4 syringe twice weekly (0.2 mg/ml) cream, [facility].  ?

## 2021-05-14 NOTE — Progress Notes (Signed)
? ? ?Belinda Fowler 1955/04/14 [id number] ? ? ?History:    66 y.o.  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 ?  ?RP:  Established patient presenting for annual gyn exam  ?  ?HPI: S/P Total Hysterectomy with RSO.  Postmenopause, well on no systemic hormone replacement therapy.  Using estradiol cream vaginally.  Will send prescription for estradiol (1 mL)  1/4 syringe twice weekly (0.2 mg/ml) cream, [facility].  No pelvic pain.  No pain with intercourse.  Pap Neg 05/2020.  No h/o abnormal Pap.  Breasts normal.   Mammo 04/2021 Negative.  Urine and bowel movements normal.  Body mass index 31.26.  Walks regularly.  Health labs with family physician. Colono 08/2019.  BD normal in 04/2015.  Will repeat a BD at 66 yo. ? ?Past medical history,surgical history, family history and social history were all reviewed and documented in the EPIC chart. ? ?Gynecologic History ?Patient's last menstrual period was 01/06/1990. ? ?Obstetric History ?OB History  ?Gravida Para Term Preterm AB Living  ?'2 1 1   1 1  '$ ?SAB IAB Ectopic Multiple Live Births  ?1       1  ?  ?# Outcome Date GA Lbr Len/2nd Weight Sex Delivery Anes PTL Lv  ?2 SAB           ?1 Term     F Vag-Spont  N LIV  ? ? ? ?ROS: A ROS was performed and pertinent positives and negatives are included in the history. ? GENERAL: No fevers or chills. HEENT: No change in vision, no earache, sore throat or sinus congestion. NECK: No pain or stiffness. CARDIOVASCULAR: No chest pain or pressure. No palpitations. PULMONARY: No shortness of breath, cough or wheeze. GASTROINTESTINAL: No abdominal pain, nausea, vomiting or diarrhea, melena or bright red blood per rectum. GENITOURINARY: No urinary frequency, urgency, hesitancy or dysuria. MUSCULOSKELETAL: No joint or muscle pain, no back pain, no recent trauma. DERMATOLOGIC: No rash, no itching, no lesions. ENDOCRINE: No polyuria, polydipsia, no heat or cold intolerance. No recent change in weight. HEMATOLOGICAL: No  anemia or easy bruising or bleeding. NEUROLOGIC: No headache, seizures, numbness, tingling or weakness. PSYCHIATRIC: No depression, no loss of interest in normal activity or change in sleep pattern.  ?  ? ?Exam: ? ? ?BP 110/72   Pulse 72   Resp 16   Ht 5' 4.5" (1.638 m)   Wt 185 lb (83.9 kg)   LMP 01/06/1990   BMI 31.26 kg/m?  ? ?Body mass index is 31.26 kg/m?. ? ?General appearance : Well developed well nourished female. No acute distress ?HEENT: Eyes: no retinal hemorrhage or exudates,  Neck supple, trachea midline, no carotid bruits, no thyroidmegaly ?Lungs: Clear to auscultation, no rhonchi or wheezes, or rib retractions  ?Heart: Regular rate and rhythm, no murmurs or gallops ?Breast:Examined in sitting and supine position were symmetrical in appearance, no palpable masses or tenderness,  no skin retraction, no nipple inversion, no nipple discharge, no skin discoloration, no axillary or supraclavicular lymphadenopathy ?Abdomen: no palpable masses or tenderness, no rebound or guarding ?Extremities: no edema or skin discoloration or tenderness ? ?Pelvic: Vulva: Normal ?            Vagina: No gross lesions or discharge ? Cervix: No gross lesions or discharge ? Uterus  AV, normal size, shape and consistency, non-tender and mobile ? Adnexa  Without masses or tenderness ? Anus: Normal ? ? ?Assessment/Plan:  66 y.o. female for annual exam  ? ?  1. Well female exam with routine gynecological exam ?S/P Total Hysterectomy with RSO.  Postmenopause, well on no systemic hormone replacement therapy.  Using estradiol cream vaginally.  Will send prescription for estradiol (1 mL)  1/4 syringe twice weekly (0.2 mg/ml) cream, [facility].  No pelvic pain.  No pain with intercourse.  Pap Neg 05/2020.  No h/o abnormal Pap.  Breasts normal.   Mammo 04/2021 Negative.  Urine and bowel movements normal.  Body mass index 31.26.  Walks regularly.  Health labs with family physician. Colono 08/2019.  BD normal in 04/2015.  Will  repeat a BD at 65 yo. ? ?2. S/P total hysterectomy with RSO ? ?3. Postmenopausal ?S/P Total Hysterectomy with RSO.  Postmenopause, well on no systemic hormone replacement therapy. ? ?4. Post-menopausal atrophic vaginitis ?Using estradiol cream vaginally.  Will send prescription for estradiol (1 mL)  1/4 syringe twice weekly (0.2 mg/ml) cream, [facility].   ? ?5. Class 1 obesity due to excess calories with serious comorbidity and body mass index (BMI) of 31.0 to 31.9 in adult ?Body mass index 31.26.  Walks regularly.  Low calorie/carb diet. ? ?Other orders ?- ALPRAZolam (XANAX PO); Take by mouth as needed.  ? ?Princess Bruins MD, 1:52 PM 05/14/2021 ? ?  ?

## 2021-06-06 ENCOUNTER — Other Ambulatory Visit (HOSPITAL_COMMUNITY): Payer: Self-pay

## 2021-06-06 MED ORDER — NAPROXEN 500 MG PO TABS
500.0000 mg | ORAL_TABLET | Freq: Three times a day (TID) | ORAL | 1 refills | Status: DC | PRN
Start: 2021-06-06 — End: 2021-09-27
  Filled 2021-06-06: qty 30, 10d supply, fill #0
  Filled 2021-07-16: qty 30, 10d supply, fill #1

## 2021-07-16 ENCOUNTER — Other Ambulatory Visit (HOSPITAL_COMMUNITY): Payer: Self-pay

## 2021-07-16 MED ORDER — HYDROCHLOROTHIAZIDE 12.5 MG PO CAPS
12.5000 mg | ORAL_CAPSULE | Freq: Every morning | ORAL | 1 refills | Status: DC
  Filled 2021-07-16: qty 90, 90d supply, fill #0
  Filled 2021-10-17: qty 90, 90d supply, fill #1

## 2021-07-27 DIAGNOSIS — H524 Presbyopia: Secondary | ICD-10-CM | POA: Diagnosis not present

## 2021-09-27 ENCOUNTER — Other Ambulatory Visit (HOSPITAL_COMMUNITY): Payer: Self-pay

## 2021-09-27 DIAGNOSIS — E7800 Pure hypercholesterolemia, unspecified: Secondary | ICD-10-CM | POA: Diagnosis not present

## 2021-09-27 DIAGNOSIS — I10 Essential (primary) hypertension: Secondary | ICD-10-CM | POA: Diagnosis not present

## 2021-09-27 DIAGNOSIS — M25561 Pain in right knee: Secondary | ICD-10-CM | POA: Diagnosis not present

## 2021-09-27 DIAGNOSIS — Z658 Other specified problems related to psychosocial circumstances: Secondary | ICD-10-CM | POA: Diagnosis not present

## 2021-09-27 MED ORDER — NAPROXEN 500 MG PO TABS
500.0000 mg | ORAL_TABLET | Freq: Three times a day (TID) | ORAL | 5 refills | Status: DC | PRN
  Filled 2021-09-27: qty 30, 10d supply, fill #0
  Filled 2022-05-23: qty 30, 10d supply, fill #1
  Filled 2022-07-09: qty 30, 10d supply, fill #2
  Filled 2022-08-07: qty 30, 10d supply, fill #3
  Filled 2022-09-09: qty 30, 10d supply, fill #4

## 2021-09-27 MED ORDER — CYCLOBENZAPRINE HCL 5 MG PO TABS
5.0000 mg | ORAL_TABLET | Freq: Every day | ORAL | 4 refills | Status: DC
  Filled 2021-09-27: qty 30, 30d supply, fill #0
  Filled 2022-04-02: qty 30, 30d supply, fill #1
  Filled 2022-06-11: qty 30, 30d supply, fill #2

## 2021-10-17 ENCOUNTER — Other Ambulatory Visit (HOSPITAL_COMMUNITY): Payer: Self-pay

## 2022-01-11 ENCOUNTER — Other Ambulatory Visit (HOSPITAL_COMMUNITY): Payer: Self-pay

## 2022-01-14 ENCOUNTER — Other Ambulatory Visit (HOSPITAL_COMMUNITY): Payer: Self-pay

## 2022-01-14 MED ORDER — HYDROCHLOROTHIAZIDE 12.5 MG PO CAPS
12.5000 mg | ORAL_CAPSULE | ORAL | 1 refills | Status: DC
Start: 2022-01-14 — End: 2022-07-29
  Filled 2022-01-14: qty 90, 90d supply, fill #0
  Filled 2022-04-16: qty 90, 90d supply, fill #1

## 2022-02-26 ENCOUNTER — Other Ambulatory Visit: Payer: Self-pay | Admitting: Obstetrics & Gynecology

## 2022-02-26 DIAGNOSIS — Z1231 Encounter for screening mammogram for malignant neoplasm of breast: Secondary | ICD-10-CM

## 2022-04-02 ENCOUNTER — Other Ambulatory Visit: Payer: Self-pay

## 2022-04-02 ENCOUNTER — Other Ambulatory Visit (HOSPITAL_COMMUNITY): Payer: Self-pay

## 2022-04-02 DIAGNOSIS — R040 Epistaxis: Secondary | ICD-10-CM | POA: Diagnosis not present

## 2022-04-02 DIAGNOSIS — I10 Essential (primary) hypertension: Secondary | ICD-10-CM | POA: Diagnosis not present

## 2022-04-02 DIAGNOSIS — Z658 Other specified problems related to psychosocial circumstances: Secondary | ICD-10-CM | POA: Diagnosis not present

## 2022-04-02 DIAGNOSIS — M25561 Pain in right knee: Secondary | ICD-10-CM | POA: Diagnosis not present

## 2022-04-02 DIAGNOSIS — E7800 Pure hypercholesterolemia, unspecified: Secondary | ICD-10-CM | POA: Diagnosis not present

## 2022-04-02 MED ORDER — ALPRAZOLAM 0.25 MG PO TABS
0.2500 mg | ORAL_TABLET | Freq: Three times a day (TID) | ORAL | 0 refills | Status: DC | PRN
  Filled 2022-04-02: qty 30, 10d supply, fill #0

## 2022-04-16 ENCOUNTER — Ambulatory Visit
Admission: RE | Admit: 2022-04-16 | Discharge: 2022-04-16 | Disposition: A | Discharge status: Home / Self Care | Payer: Commercial Managed Care - PPO | Source: Ambulatory Visit | Attending: Obstetrics & Gynecology | Admitting: Obstetrics & Gynecology

## 2022-04-16 DIAGNOSIS — Z1231 Encounter for screening mammogram for malignant neoplasm of breast: Secondary | ICD-10-CM | POA: Diagnosis not present

## 2022-05-17 IMAGING — MG MM DIGITAL SCREENING BILAT W/ TOMO AND CAD
8 series · 8 of 24 positions shown · non-contrast
Comparison: Previous exam(s).

CLINICAL DATA: Screening.

EXAM:
DIGITAL SCREENING BILATERAL MAMMOGRAM WITH TOMOSYNTHESIS AND CAD
TECHNIQUE: Bilateral screening digital craniocaudal and mediolateral oblique
mammograms were obtained. Bilateral screening digital breast
tomosynthesis was performed. The images were evaluated with
computer-aided detection.

[R CC synth-2D]
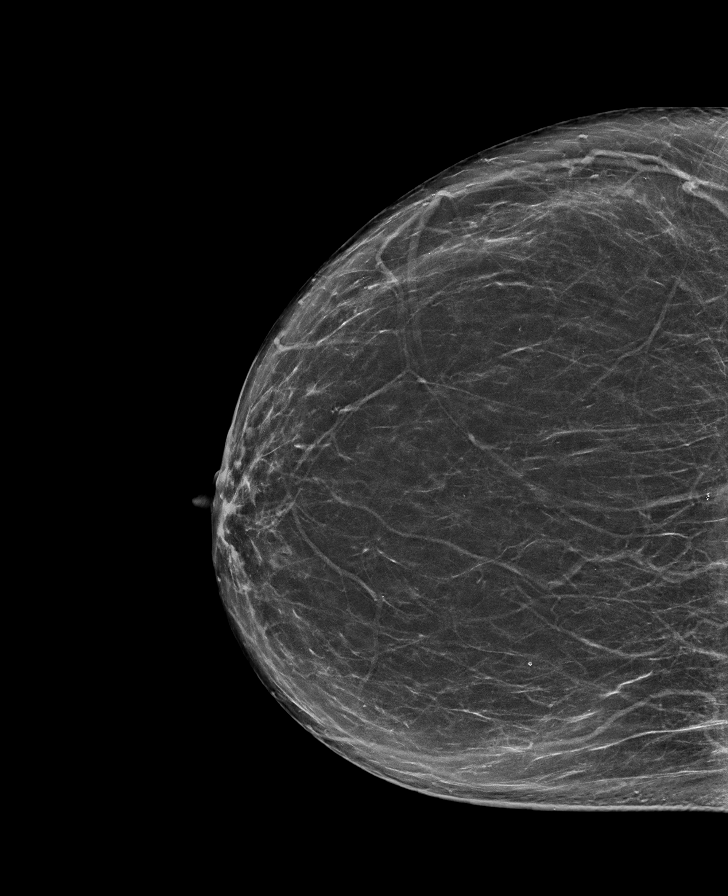

[L MLO synth-2D]
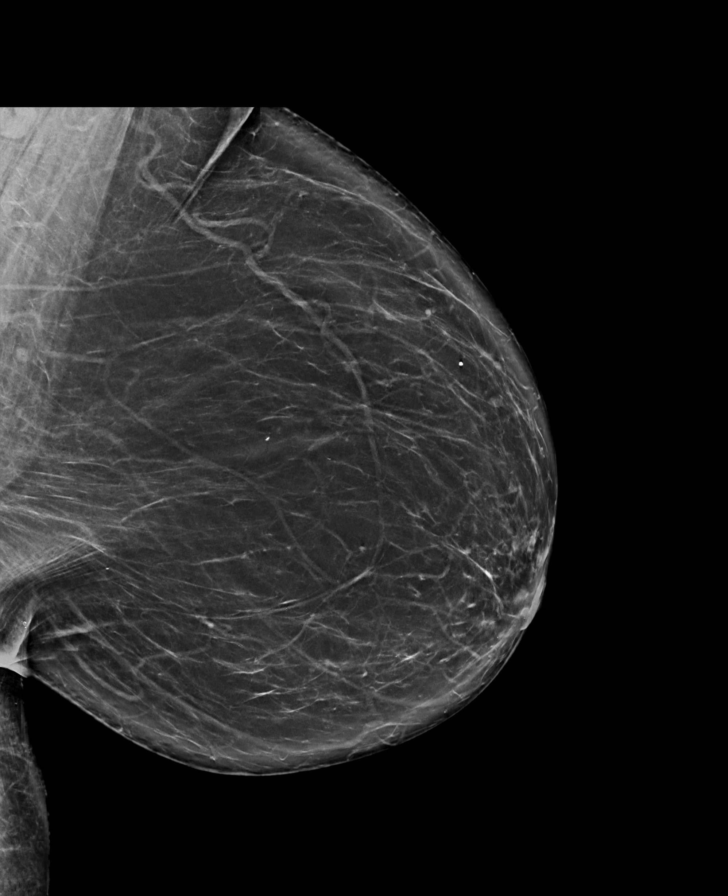

[R MLO synth-2D]
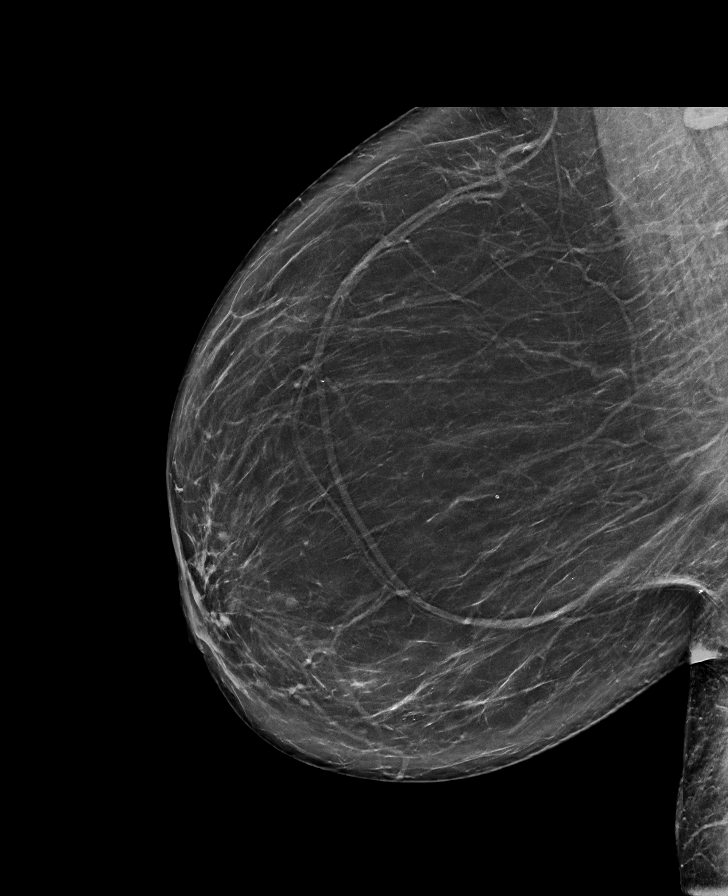

[L CC synth-2D]
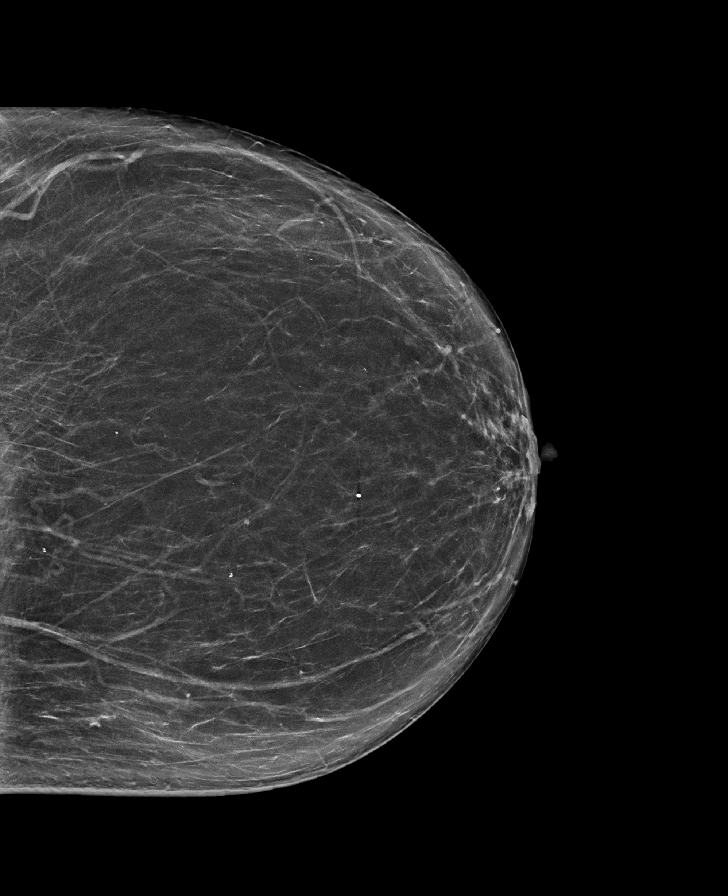

[L MLO tomo · tomo slice 46/91.0]
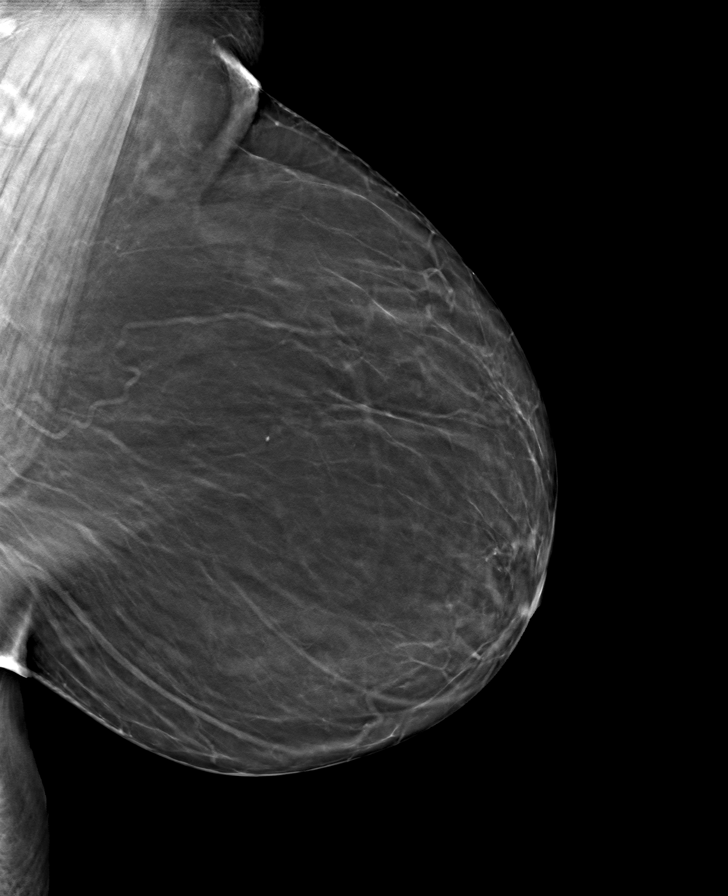

[R CC tomo · tomo slice 42/83.0]
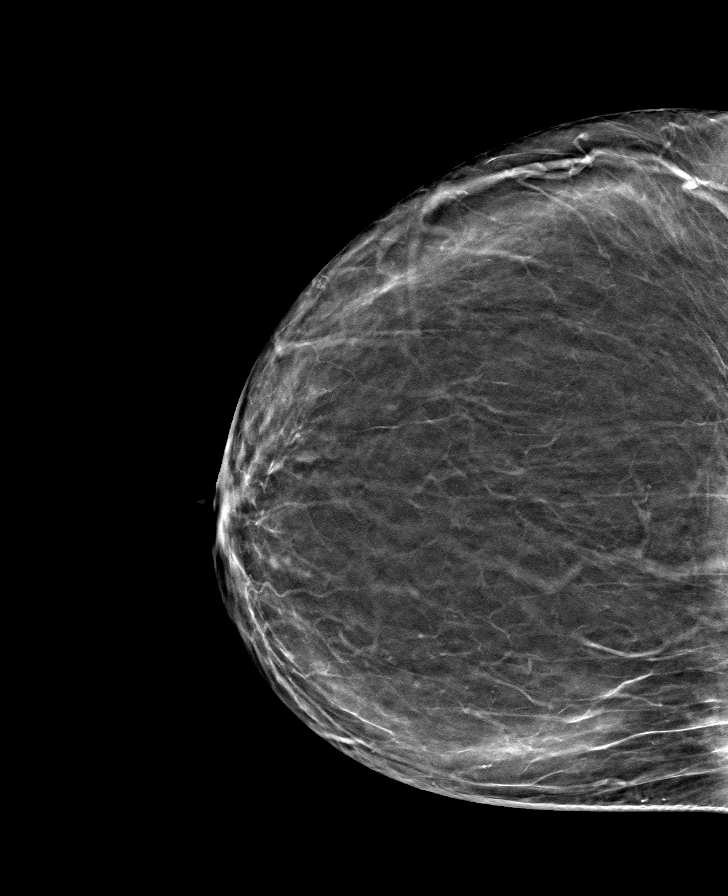

[L CC tomo · tomo slice 39/78.0]
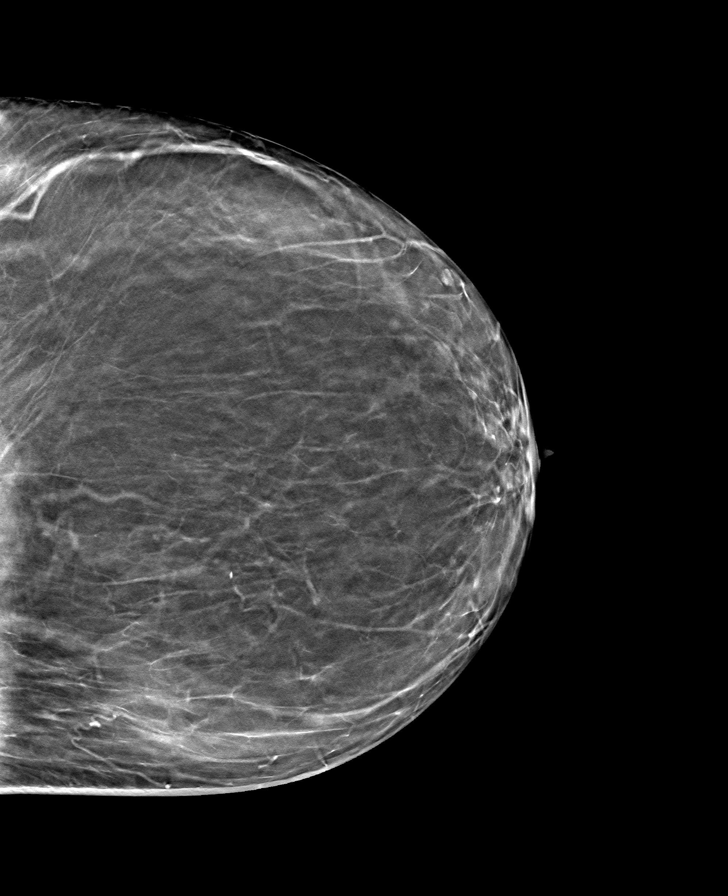

[R MLO tomo · tomo slice 45/89.0]
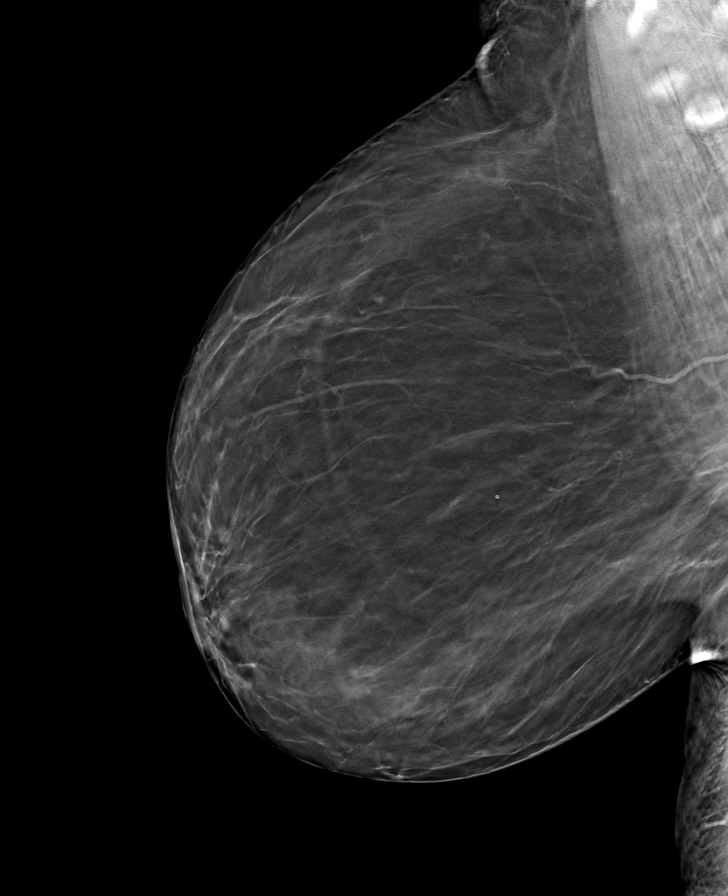

[8 of 24 positions shown; findings below may reference images not displayed]

ACR Breast Density Category b: There are scattered areas of
fibroglandular density.
FINDINGS: There are no findings suspicious for malignancy.
IMPRESSION: No mammographic evidence of malignancy. A result letter of this
screening mammogram will be mailed directly to the patient.

RECOMMENDATION:
Screening mammogram in one year. (Code:51-O-LD2)

BI-RADS CATEGORY  1: Negative.

## 2022-05-23 ENCOUNTER — Other Ambulatory Visit (HOSPITAL_COMMUNITY): Payer: Self-pay

## 2022-06-11 ENCOUNTER — Other Ambulatory Visit (HOSPITAL_COMMUNITY): Payer: Self-pay

## 2022-06-17 ENCOUNTER — Ambulatory Visit (INDEPENDENT_AMBULATORY_CARE_PROVIDER_SITE_OTHER): Payer: Commercial Managed Care - PPO | Admitting: Obstetrics & Gynecology

## 2022-06-17 ENCOUNTER — Encounter: Payer: Self-pay | Admitting: Obstetrics & Gynecology

## 2022-06-17 VITALS — BP 112/74 | HR 60 | Ht 65.25 in | Wt 187.0 lb

## 2022-06-17 DIAGNOSIS — Z90710 Acquired absence of both cervix and uterus: Secondary | ICD-10-CM | POA: Diagnosis not present

## 2022-06-17 DIAGNOSIS — Z01419 Encounter for gynecological examination (general) (routine) without abnormal findings: Secondary | ICD-10-CM | POA: Diagnosis not present

## 2022-06-17 DIAGNOSIS — Z780 Asymptomatic menopausal state: Secondary | ICD-10-CM | POA: Diagnosis not present

## 2022-06-17 NOTE — Progress Notes (Signed)
Belinda Fowler February 28, 1955 [id number]   History:    67 y.o.  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   RP:  Established patient presenting for annual gyn exam    HPI: S/P Total Hysterectomy with RSO.  Postmenopause, well on no systemic hormone replacement therapy.  No pelvic pain.  No pain with intercourse.  Pap Neg 05/2020.  No h/o abnormal Pap. Repeat at 5 years. Breasts normal. Mammo 04/2022 negative.   Urine and bowel movements normal.  Body mass index 30.88.  Walks regularly.  Health labs with family physician. Colono 08/2019, repeat in 2026.  BD normal in 04/2015.  Will repeat a BD at 67 yo.   Past medical history,surgical history, family history and social history were all reviewed and documented in the EPIC chart.  Gynecologic History Patient's last menstrual period was 01/06/1990.  Obstetric History OB History  Gravida Para Term Preterm AB Living  2 1 1   1 1   SAB IAB Ectopic Multiple Live Births  1       1    # Outcome Date GA Lbr Len/2nd Weight Sex Delivery Anes PTL Lv  2 SAB           1 Term     F Vag-Spont  N LIV     ROS: A ROS was performed and pertinent positives and negatives are included in the history. GENERAL: No fevers or chills. HEENT: No change in vision, no earache, sore throat or sinus congestion. NECK: No pain or stiffness. CARDIOVASCULAR: No chest pain or pressure. No palpitations. PULMONARY: No shortness of breath, cough or wheeze. GASTROINTESTINAL: No abdominal pain, nausea, vomiting or diarrhea, melena or bright red blood per rectum. GENITOURINARY: No urinary frequency, urgency, hesitancy or dysuria. MUSCULOSKELETAL: No joint or muscle pain, no back pain, no recent trauma. DERMATOLOGIC: No rash, no itching, no lesions. ENDOCRINE: No polyuria, polydipsia, no heat or cold intolerance. No recent change in weight. HEMATOLOGICAL: No anemia or easy bruising or bleeding. NEUROLOGIC: No headache, seizures, numbness, tingling or weakness.  PSYCHIATRIC: No depression, no loss of interest in normal activity or change in sleep pattern.     Exam:   BP 112/74   Pulse 60   Ht 5' 5.25" (1.657 m)   Wt 187 lb (84.8 kg)   LMP 01/06/1990   SpO2 99%   BMI 30.88 kg/m   Body mass index is 30.88 kg/m.  General appearance : Well developed well nourished female. No acute distress HEENT: Eyes: no retinal hemorrhage or exudates,  Neck supple, trachea midline, no carotid bruits, no thyroidmegaly Lungs: Clear to auscultation, no rhonchi or wheezes, or rib retractions  Heart: Regular rate and rhythm, no murmurs or gallops Breast:Examined in sitting and supine position were symmetrical in appearance, no palpable masses or tenderness,  no skin retraction, no nipple inversion, no nipple discharge, no skin discoloration, no axillary or supraclavicular lymphadenopathy Abdomen: no palpable masses or tenderness, no rebound or guarding Extremities: no edema or skin discoloration or tenderness  Pelvic: Vulva: Normal             Vagina: No gross lesions or discharge  Cervix/Uterus absent  Adnexa  Without masses or tenderness  Anus: Normal   Assessment/Plan:  67 y.o. female for annual exam   1. Well female exam with routine gynecological exam S/P Total Hysterectomy with RSO.  Postmenopause, well on no systemic hormone replacement therapy.  No pelvic pain.  No pain with intercourse.  Pap Neg 05/2020.  No h/o abnormal Pap. Repeat at 5 years. Breasts normal. Mammo 04/2022 negative.   Urine and bowel movements normal.  Body mass index 30.88.  Walks regularly.  Health labs with family physician. Colono 08/2019, repeat in 2026.  BD normal in 04/2015.  Will repeat a BD at 67 yo.  2. S/P total hysterectomy with RSO  3. Postmenopausal  S/P Total Hysterectomy with RSO.  Postmenopause, well on no systemic hormone replacement therapy.  No pelvic pain.  No pain with intercourse.    Genia Del MD, 2:31 PM

## 2022-07-29 ENCOUNTER — Other Ambulatory Visit (HOSPITAL_COMMUNITY): Payer: Self-pay

## 2022-07-29 MED ORDER — HYDROCHLOROTHIAZIDE 12.5 MG PO CAPS
12.5000 mg | ORAL_CAPSULE | Freq: Every morning | ORAL | 0 refills | Status: DC
  Filled 2022-07-29: qty 90, 90d supply, fill #0

## 2022-08-07 ENCOUNTER — Other Ambulatory Visit (HOSPITAL_COMMUNITY): Payer: Self-pay

## 2022-09-03 DIAGNOSIS — H524 Presbyopia: Secondary | ICD-10-CM | POA: Diagnosis not present

## 2022-09-05 ENCOUNTER — Other Ambulatory Visit (HOSPITAL_COMMUNITY): Payer: Self-pay

## 2022-09-05 MED ORDER — CYCLOBENZAPRINE HCL 5 MG PO TABS
5.0000 mg | ORAL_TABLET | Freq: Every day | ORAL | 0 refills | Status: DC
  Filled 2022-09-05: qty 30, 30d supply, fill #0

## 2022-10-01 ENCOUNTER — Other Ambulatory Visit (HOSPITAL_COMMUNITY): Payer: Self-pay

## 2022-10-01 DIAGNOSIS — I10 Essential (primary) hypertension: Secondary | ICD-10-CM | POA: Diagnosis not present

## 2022-10-01 DIAGNOSIS — M25561 Pain in right knee: Secondary | ICD-10-CM | POA: Diagnosis not present

## 2022-10-01 DIAGNOSIS — Z658 Other specified problems related to psychosocial circumstances: Secondary | ICD-10-CM | POA: Diagnosis not present

## 2022-10-01 DIAGNOSIS — E7800 Pure hypercholesterolemia, unspecified: Secondary | ICD-10-CM | POA: Diagnosis not present

## 2022-10-01 MED ORDER — DICLOFENAC SODIUM 1 % EX GEL
4.0000 g | Freq: Two times a day (BID) | CUTANEOUS | 5 refills | Status: AC | PRN
  Filled 2022-10-01: qty 100, 14d supply, fill #0
  Filled 2022-11-21: qty 100, 14d supply, fill #1

## 2022-10-24 ENCOUNTER — Other Ambulatory Visit (HOSPITAL_COMMUNITY): Payer: Self-pay

## 2022-10-25 ENCOUNTER — Other Ambulatory Visit (HOSPITAL_COMMUNITY): Payer: Self-pay

## 2022-10-25 MED ORDER — CYCLOBENZAPRINE HCL 5 MG PO TABS
5.0000 mg | ORAL_TABLET | Freq: Every day | ORAL | 1 refills | Status: DC | PRN
  Filled 2022-10-25: qty 30, 30d supply, fill #0
  Filled 2023-05-12: qty 30, 30d supply, fill #1

## 2022-10-25 MED ORDER — NAPROXEN 500 MG PO TABS
500.0000 mg | ORAL_TABLET | Freq: Three times a day (TID) | ORAL | 1 refills | Status: DC | PRN
  Filled 2022-10-25: qty 30, 10d supply, fill #0
  Filled 2023-01-21: qty 30, 10d supply, fill #1

## 2022-10-25 MED ORDER — ALPRAZOLAM 0.25 MG PO TABS
0.2500 mg | ORAL_TABLET | Freq: Three times a day (TID) | ORAL | 0 refills | Status: DC | PRN
  Filled 2022-10-25: qty 30, 10d supply, fill #0

## 2022-10-28 ENCOUNTER — Other Ambulatory Visit (HOSPITAL_COMMUNITY): Payer: Self-pay

## 2022-10-28 MED ORDER — HYDROCHLOROTHIAZIDE 12.5 MG PO CAPS
12.5000 mg | ORAL_CAPSULE | ORAL | 1 refills | Status: DC
Start: 2022-10-28 — End: 2023-05-26
  Filled 2022-10-28: qty 90, 90d supply, fill #0
  Filled 2023-02-10: qty 90, 90d supply, fill #1

## 2022-11-21 ENCOUNTER — Other Ambulatory Visit (HOSPITAL_COMMUNITY): Payer: Self-pay

## 2022-11-22 ENCOUNTER — Other Ambulatory Visit (HOSPITAL_COMMUNITY): Payer: Self-pay

## 2023-03-20 ENCOUNTER — Other Ambulatory Visit: Payer: Self-pay | Admitting: Nurse Practitioner

## 2023-03-20 DIAGNOSIS — Z0000 Encounter for general adult medical examination without abnormal findings: Secondary | ICD-10-CM

## 2023-04-24 ENCOUNTER — Ambulatory Visit
Admission: RE | Admit: 2023-04-24 | Discharge: 2023-04-24 | Disposition: A | Discharge status: Home / Self Care | Payer: Commercial Managed Care - PPO | Source: Ambulatory Visit | Attending: Nurse Practitioner | Admitting: Nurse Practitioner

## 2023-04-24 DIAGNOSIS — Z1231 Encounter for screening mammogram for malignant neoplasm of breast: Secondary | ICD-10-CM | POA: Diagnosis not present

## 2023-04-24 DIAGNOSIS — Z0000 Encounter for general adult medical examination without abnormal findings: Secondary | ICD-10-CM

## 2023-04-29 DIAGNOSIS — E7800 Pure hypercholesterolemia, unspecified: Secondary | ICD-10-CM | POA: Diagnosis not present

## 2023-04-29 DIAGNOSIS — I10 Essential (primary) hypertension: Secondary | ICD-10-CM | POA: Diagnosis not present

## 2023-05-02 ENCOUNTER — Other Ambulatory Visit: Payer: Self-pay | Admitting: Family Medicine

## 2023-05-02 ENCOUNTER — Other Ambulatory Visit (HOSPITAL_COMMUNITY): Payer: Self-pay | Admitting: Family Medicine

## 2023-05-02 DIAGNOSIS — Z23 Encounter for immunization: Secondary | ICD-10-CM | POA: Diagnosis not present

## 2023-05-02 DIAGNOSIS — N951 Menopausal and female climacteric states: Secondary | ICD-10-CM | POA: Diagnosis not present

## 2023-05-02 DIAGNOSIS — M25561 Pain in right knee: Secondary | ICD-10-CM | POA: Diagnosis not present

## 2023-05-02 DIAGNOSIS — Z13820 Encounter for screening for osteoporosis: Secondary | ICD-10-CM | POA: Diagnosis not present

## 2023-05-02 DIAGNOSIS — I10 Essential (primary) hypertension: Secondary | ICD-10-CM | POA: Diagnosis not present

## 2023-05-02 DIAGNOSIS — Z1239 Encounter for other screening for malignant neoplasm of breast: Secondary | ICD-10-CM | POA: Diagnosis not present

## 2023-05-02 DIAGNOSIS — E2839 Other primary ovarian failure: Secondary | ICD-10-CM

## 2023-05-02 DIAGNOSIS — Z1211 Encounter for screening for malignant neoplasm of colon: Secondary | ICD-10-CM | POA: Diagnosis not present

## 2023-05-02 DIAGNOSIS — Z658 Other specified problems related to psychosocial circumstances: Secondary | ICD-10-CM | POA: Diagnosis not present

## 2023-05-02 DIAGNOSIS — E7800 Pure hypercholesterolemia, unspecified: Secondary | ICD-10-CM

## 2023-05-02 DIAGNOSIS — Z0189 Encounter for other specified special examinations: Secondary | ICD-10-CM | POA: Diagnosis not present

## 2023-05-12 ENCOUNTER — Other Ambulatory Visit (HOSPITAL_COMMUNITY): Payer: Self-pay

## 2023-05-12 MED ORDER — NAPROXEN 500 MG PO TABS
500.0000 mg | ORAL_TABLET | Freq: Three times a day (TID) | ORAL | 1 refills | Status: DC | PRN
Start: 2023-05-12 — End: 2023-12-16
  Filled 2023-05-12: qty 30, 10d supply, fill #0

## 2023-05-13 ENCOUNTER — Other Ambulatory Visit (HOSPITAL_COMMUNITY): Payer: Self-pay

## 2023-05-13 MED ORDER — ALPRAZOLAM 0.25 MG PO TABS
0.2500 mg | ORAL_TABLET | Freq: Three times a day (TID) | ORAL | 0 refills | Status: DC | PRN
  Filled 2023-05-13: qty 30, 10d supply, fill #0

## 2023-05-23 ENCOUNTER — Encounter (HOSPITAL_COMMUNITY): Payer: Self-pay

## 2023-05-23 ENCOUNTER — Ambulatory Visit (HOSPITAL_COMMUNITY)
Admission: RE | Admit: 2023-05-23 | Discharge: 2023-05-23 | Disposition: A | Discharge status: Home / Self Care | Payer: Self-pay | Source: Ambulatory Visit | Attending: Family Medicine | Admitting: Family Medicine

## 2023-05-23 DIAGNOSIS — E7800 Pure hypercholesterolemia, unspecified: Secondary | ICD-10-CM | POA: Insufficient documentation

## 2023-05-26 ENCOUNTER — Other Ambulatory Visit (HOSPITAL_COMMUNITY): Payer: Self-pay

## 2023-05-26 MED ORDER — HYDROCHLOROTHIAZIDE 12.5 MG PO CAPS
12.5000 mg | ORAL_CAPSULE | ORAL | 1 refills | Status: DC
  Filled 2023-05-26: qty 90, 90d supply, fill #0
  Filled 2023-08-28: qty 90, 90d supply, fill #1
  Filled 2023-12-01: qty 90, 90d supply, fill #2

## 2023-05-29 ENCOUNTER — Other Ambulatory Visit (HOSPITAL_COMMUNITY): Payer: Self-pay

## 2023-05-29 MED ORDER — ROSUVASTATIN CALCIUM 20 MG PO TABS
20.0000 mg | ORAL_TABLET | Freq: Every day | ORAL | 3 refills | Status: AC
  Filled 2023-05-29: qty 90, 90d supply, fill #0
  Filled 2023-08-28: qty 90, 90d supply, fill #1
  Filled 2023-12-01: qty 90, 90d supply, fill #2
  Filled 2024-02-24: qty 90, 90d supply, fill #3

## 2023-06-18 ENCOUNTER — Ambulatory Visit (INDEPENDENT_AMBULATORY_CARE_PROVIDER_SITE_OTHER): Payer: Commercial Managed Care - PPO | Admitting: Nurse Practitioner

## 2023-06-18 ENCOUNTER — Encounter: Payer: Self-pay | Admitting: Nurse Practitioner

## 2023-06-18 VITALS — BP 122/72 | HR 63 | Ht 64.57 in | Wt 166.0 lb

## 2023-06-18 DIAGNOSIS — Z1331 Encounter for screening for depression: Secondary | ICD-10-CM

## 2023-06-18 DIAGNOSIS — Z01419 Encounter for gynecological examination (general) (routine) without abnormal findings: Secondary | ICD-10-CM | POA: Diagnosis not present

## 2023-06-18 DIAGNOSIS — Z780 Asymptomatic menopausal state: Secondary | ICD-10-CM | POA: Diagnosis not present

## 2023-06-18 NOTE — Progress Notes (Addendum)
   Belinda Fowler 01-26-1956 [id number]   History:  68 y.o. G2P1011 presents for annual exam. Postmenopausal - no HRT. S/P 1991 total hysterectomy with RSO for fibroids. Normal pap history. HTN, HLD managed by PCP.   Gynecologic History Patient's last menstrual period was 01/06/1990.   Contraception/Family planning: status post hysterectomy Sexually active: Yes  Health Maintenance Last Pap: 05/12/2020. Results were: Normal Last mammogram: 04/24/2023. Results were: Normal Last colonoscopy: 09/06/2019. 5-year recall Last Dexa: 05/09/2015. Results were: Normal. Scheduled 01/07/24 Exercising: Yes. Walking  Smoker: no      06/18/2023    7:50 AM  Depression screen PHQ 2/9  Decreased Interest 0  Down, Depressed, Hopeless 0  PHQ - 2 Score 0     Past medical history, past surgical history, family history and social history were all reviewed and documented in the EPIC chart. 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   ROS:  A ROS was performed and pertinent positives and negatives are included.  Exam:  Vitals:   06/18/23 0747  BP: 122/72  Pulse: 63  SpO2: 99%  Weight: 166 lb (75.3 kg)  Height: 5' 4.57" (1.64 m)   Body mass index is 28 kg/m.  General appearance:  Normal Thyroid :  Symmetrical, normal in size, without palpable masses or nodularity. Respiratory  Auscultation:  Clear without wheezing or rhonchi Cardiovascular  Auscultation:  Regular rate, without rubs, murmurs or gallops  Edema/varicosities:  Not grossly evident Abdominal  Soft,nontender, without masses, guarding or rebound.  Liver/spleen:  No organomegaly noted  Hernia:  None appreciated  Skin  Inspection:  Grossly normal Breasts: Examined lying and sitting.   Right: Without masses, retractions, nipple discharge or axillary adenopathy.   Left: Without masses, retractions, nipple discharge or axillary adenopathy. Pelvic: External genitalia:  no lesions              Urethra:  normal  appearing urethra with no masses, tenderness or lesions              Bartholins and Skenes: normal                 Vagina: absent              Cervix: absent Bimanual Exam:  Uterus:  no masses or tenderness              Adnexa: no mass, fullness, tenderness              Rectovaginal: Deferred              Anus:  normal, no lesions  Doria Garden, NP student present as chaperone.   Assessment/Plan:  68 y.o. G2P1011 for annual exam.   Well female exam with routine gynecological exam - Education provided on SBEs, importance of preventative screenings, current guidelines, high calcium  diet, regular exercise, and multivitamin daily. Labs with PCP.  Postmenopausal - no HRT  Screening for cervical cancer - Normal Pap history. No longer screening per guidelines.   Screening for breast cancer - Normal mammogram history.  Continue annual screenings.  Normal breast exam today.  Screening for colon cancer - 2021 colonoscopy. Will repeat at 5-year interval per GI's recommendation.   Screening for osteoporosis - Normal bone density in 2017. DXA scheduled in November.   Return in about 1 year (around 06/17/2024) for Annual.    Andee Bamberger DNP, 8:11 AM 06/18/2023

## 2023-07-01 DIAGNOSIS — E7800 Pure hypercholesterolemia, unspecified: Secondary | ICD-10-CM | POA: Diagnosis not present

## 2023-09-16 DIAGNOSIS — H524 Presbyopia: Secondary | ICD-10-CM | POA: Diagnosis not present

## 2023-10-31 DIAGNOSIS — I10 Essential (primary) hypertension: Secondary | ICD-10-CM | POA: Diagnosis not present

## 2023-10-31 DIAGNOSIS — E7800 Pure hypercholesterolemia, unspecified: Secondary | ICD-10-CM | POA: Diagnosis not present

## 2023-11-03 DIAGNOSIS — I10 Essential (primary) hypertension: Secondary | ICD-10-CM | POA: Diagnosis not present

## 2023-11-03 DIAGNOSIS — Z23 Encounter for immunization: Secondary | ICD-10-CM | POA: Diagnosis not present

## 2023-11-03 DIAGNOSIS — I2510 Atherosclerotic heart disease of native coronary artery without angina pectoris: Secondary | ICD-10-CM | POA: Diagnosis not present

## 2023-11-03 DIAGNOSIS — E7800 Pure hypercholesterolemia, unspecified: Secondary | ICD-10-CM | POA: Diagnosis not present

## 2023-11-03 DIAGNOSIS — M25561 Pain in right knee: Secondary | ICD-10-CM | POA: Diagnosis not present

## 2023-11-03 DIAGNOSIS — Z658 Other specified problems related to psychosocial circumstances: Secondary | ICD-10-CM | POA: Diagnosis not present

## 2023-11-10 ENCOUNTER — Other Ambulatory Visit (HOSPITAL_COMMUNITY): Payer: Self-pay

## 2023-11-11 ENCOUNTER — Other Ambulatory Visit (HOSPITAL_COMMUNITY): Payer: Self-pay

## 2023-11-11 MED ORDER — CYCLOBENZAPRINE HCL 5 MG PO TABS
5.0000 mg | ORAL_TABLET | Freq: Every day | ORAL | 0 refills | Status: DC | PRN
  Filled 2023-11-11: qty 30, 30d supply, fill #0

## 2023-11-11 MED ORDER — ALPRAZOLAM 0.25 MG PO TABS
0.2500 mg | ORAL_TABLET | Freq: Three times a day (TID) | ORAL | 0 refills | Status: DC | PRN
  Filled 2023-11-11: qty 30, 10d supply, fill #0

## 2023-11-25 ENCOUNTER — Ambulatory Visit (HOSPITAL_BASED_OUTPATIENT_CLINIC_OR_DEPARTMENT_OTHER)
Admission: RE | Admit: 2023-11-25 | Discharge: 2023-11-25 | Disposition: A | Discharge status: Home / Self Care | Source: Ambulatory Visit | Attending: Family Medicine | Admitting: Family Medicine

## 2023-11-25 DIAGNOSIS — E2839 Other primary ovarian failure: Secondary | ICD-10-CM | POA: Insufficient documentation

## 2023-11-25 DIAGNOSIS — Z780 Asymptomatic menopausal state: Secondary | ICD-10-CM | POA: Diagnosis not present

## 2023-12-01 ENCOUNTER — Other Ambulatory Visit (HOSPITAL_COMMUNITY): Payer: Self-pay

## 2023-12-01 ENCOUNTER — Other Ambulatory Visit: Payer: Self-pay

## 2023-12-01 DIAGNOSIS — L309 Dermatitis, unspecified: Secondary | ICD-10-CM | POA: Diagnosis not present

## 2023-12-01 DIAGNOSIS — R040 Epistaxis: Secondary | ICD-10-CM | POA: Diagnosis not present

## 2023-12-01 MED ORDER — HYDROCHLOROTHIAZIDE 12.5 MG PO CAPS
12.5000 mg | ORAL_CAPSULE | Freq: Every morning | ORAL | 1 refills | Status: AC
  Filled 2023-12-01: qty 90, 90d supply, fill #0
  Filled 2024-02-24: qty 90, 90d supply, fill #1

## 2023-12-01 MED ORDER — TRIAMCINOLONE ACETONIDE 0.1 % EX OINT
1.0000 | TOPICAL_OINTMENT | Freq: Two times a day (BID) | CUTANEOUS | 2 refills | Status: AC
  Filled 2023-12-01: qty 160, 30d supply, fill #0

## 2023-12-02 ENCOUNTER — Other Ambulatory Visit (HOSPITAL_COMMUNITY): Payer: Self-pay

## 2023-12-16 ENCOUNTER — Other Ambulatory Visit (HOSPITAL_COMMUNITY): Payer: Self-pay

## 2023-12-16 ENCOUNTER — Ambulatory Visit: Attending: Cardiology | Admitting: Cardiology

## 2023-12-16 ENCOUNTER — Encounter: Payer: Self-pay | Admitting: Cardiology

## 2023-12-16 VITALS — BP 134/79 | HR 66 | Ht 64.0 in | Wt 166.8 lb

## 2023-12-16 DIAGNOSIS — I2510 Atherosclerotic heart disease of native coronary artery without angina pectoris: Secondary | ICD-10-CM | POA: Diagnosis not present

## 2023-12-16 DIAGNOSIS — E7800 Pure hypercholesterolemia, unspecified: Secondary | ICD-10-CM | POA: Diagnosis not present

## 2023-12-16 DIAGNOSIS — I10 Essential (primary) hypertension: Secondary | ICD-10-CM

## 2023-12-16 DIAGNOSIS — R079 Chest pain, unspecified: Secondary | ICD-10-CM

## 2023-12-16 DIAGNOSIS — R072 Precordial pain: Secondary | ICD-10-CM | POA: Diagnosis not present

## 2023-12-16 MED ORDER — METOPROLOL TARTRATE 50 MG PO TABS
50.0000 mg | ORAL_TABLET | ORAL | 0 refills | Status: DC
  Filled 2023-12-16 – 2023-12-31 (×2): qty 1, 1d supply, fill #0

## 2023-12-16 NOTE — Patient Instructions (Signed)
 Medication Instructions:  The current medical regimen is effective;  continue present plan and medications.  *If you need a refill on your cardiac medications before your next appointment, please call your pharmacy*  Testing/Procedures:   Your cardiac CT will be scheduled at:   [facility] [address]  [place], [place] [address]  Please enter the parking lot using the [address] entrance and use the FREE valet service at the patient drop-off area. Enter the building and check-in with registration on the main floor.  Please follow these instructions carefully (unless otherwise directed):  An IV will be required for this test and Nitroglycerin will be given.  Hold all erectile dysfunction medications at least 3 days (72 hrs) prior to test. (Ie viagra, cialis, sildenafil, tadalafil, etc)   On the Night Before the Test: Be sure to Drink plenty of water. Do not consume any caffeinated/decaffeinated beverages or chocolate 12 hours prior to your test. Do not take any antihistamines 12 hours prior to your test.  On the Day of the Test: Drink plenty of water until 1 hour prior to the test. Do not eat any food 1 hour prior to test. You may take your regular medications prior to the test.  Take metoprolol (Lopressor) two hours prior to test. If you take Furosemide/Hydrochlorothiazide /Spironolactone/Chlorthalidone, please HOLD on the morning of the test. Patients who wear a continuous glucose monitor MUST remove the device prior to scanning. FEMALES- please wear underwire-free bra if available, avoid dresses & tight clothing      After the Test: Drink plenty of water. After receiving IV contrast, you may experience a mild flushed feeling. This is normal. On occasion, you may experience a mild rash up to 24 hours after the test. This is not dangerous. If this occurs, you can take Benadryl 25 mg, Zyrtec, Claritin, or Allegra and increase your fluid intake.  (Patients taking Tikosyn should avoid Benadryl, and may take Zyrtec, Claritin, or Allegra) If you experience trouble breathing, this can be serious. If it is severe call 911 IMMEDIATELY. If it is mild, please call our office.  We will call to schedule your test 2-4 weeks out understanding that some insurance companies will need an authorization prior to the service being performed.   For more information and frequently asked questions, please visit our website : [email or web address]  For non-scheduling related questions, please contact the cardiac imaging nurse navigator should you have any questions/concerns: Cardiac Imaging Nurse Navigators Direct Office Dial: [phone]   For scheduling needs, including cancellations and rescheduling, please call Brittany, [phone].   Follow-Up: At [facility], you and your health needs are our priority.  As part of our continuing mission to provide you with exceptional heart care, our providers are all part of one team.  This team includes your primary Cardiologist (physician) and Advanced Practice Providers or APPs (Physician Assistants and Nurse Practitioners) who all work together to provide you with the care you need, when you need it.  Your next appointment:   1 year(s)  Provider:   One of our Advanced Practice Providers (APPs): Morse Clause, PA-C  Lamarr Satterfield, NP Miriam Shams, NP  Olivia Pavy, PA-C Josefa Beauvais, NP  Leontine Salen, PA-C Orren Fabry, PA-C  [place], PA-C Ernest Dick, NP  Damien Braver, NP Jon Hails, PA-C  Waddell Donath, PA-C    Dayna Dunn, PA-C  Scott Weaver, PA-C Lum Louis, NP Katlyn West, NP Callie Goodrich, PA-C  Xika Zhao, NP Sheng Haley,  PA-C    Rollo Louder, PA-C       We recommend signing up for the patient portal called MyChart.  Sign up information is provided on this After Visit Summary.  MyChart is used to connect with patients for Virtual Visits  (Telemedicine).  Patients are able to view lab/test results, encounter notes, upcoming appointments, etc.  Non-urgent messages can be sent to your provider as well.   To learn more about what you can do with MyChart, go to [email or web address].

## 2023-12-16 NOTE — Progress Notes (Signed)
 Cardiology Office Note:  .   Date:  12/16/2023  ID:  Belinda Fowler, DOB 07-Nov-1955, MRN [id number] PCP: Leonel Cole, MD  [facility] Providers Cardiologist:  None     History of Present Illness: .   Belinda Fowler is a 68 y.o. female Discussed the use of AI scribe software  History of Present Illness Belinda Fowler is a 68 year old female who presents with evaluation of her coronary calcium  score and chest pain. [name] is present during the visit.  She has a coronary calcium  score of 1085, placing her in the 99th percentile for coronary plaque calcification. She experiences intermittent chest pain on the left side, described as not severe enough to warrant a hospital visit, occurring 'every now and then'.  She hears her heartbeat in her ear, particularly in the morning upon waking. This sensation is described as a 'clicking' sound, more noticeable when her ear is on the pillow, and is more of an annoyance than a concern.  In 2015, she experienced major chest pains and was hospitalized, during which a stress test was performed. 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  Her past medical history includes quitting smoking in 1979. She has a family history of heart disease, with her mother having died of a heart attack after a stroke. She reports rare alcohol use and is employed as a psychologist, sport and exercise, [employer].      Studies Reviewed: SABRA   EKG Interpretation Date/Time:  Tuesday December 16 2023 11:24:23 EST Ventricular Rate:  66 PR Interval:  156 QRS Duration:  74 QT Interval:  400 QTC Calculation: 419 R Axis:   -21  Text Interpretation: Normal sinus rhythm Low voltage QRS Possible Inferior infarct , age undetermined Non-specific ST-t changes When compared with ECG of 16-Jun-2014 11:45, Borderline criteria for Inferior infarct  are now Present Confirmed by Jeffrie Anes ([id number]) on 12/16/2023 11:30:54 AM    Results RADIOLOGY Coronary calcium  score: 1085, 99th percentile  DIAGNOSTIC REPORTS EKG: Normal Risk Assessment/Calculations:            Physical Exam:   VS:  BP 134/79   Pulse 66   Ht 5' 4 (1.626 m)   Wt 166 lb 12.8 oz (75.7 kg)   LMP 01/06/1990   SpO2 97%   BMI 28.63 kg/m    Wt Readings from Last 3 Encounters:  12/16/23 166 lb 12.8 oz (75.7 kg)  06/18/23 166 lb (75.3 kg)  06/17/22 187 lb (84.8 kg)    GEN: Well nourished, well developed in no acute distress NECK: No JVD; No carotid bruits CARDIAC: RRR, no murmurs, no rubs, no gallops RESPIRATORY:  Clear to auscultation without rales, wheezing or rhonchi  ABDOMEN: Soft, non-tender, non-distended EXTREMITIES:  No edema; No deformity   ASSESSMENT AND PLAN: .    Assessment and Plan Assessment & Plan Coronary artery calcification with chest pain Coronary artery calcification with a calcium  score of 1085, placing her in the 99th percentile. Intermittent chest pain, previously severe enough for hospitalization in 2015. Differential diagnosis includes coronary artery disease and variant angina (Prinzmetal's angina). Blood pressure is well-controlled, and EKG is normal. Family history of heart disease, with mother having had a heart attack and stroke. Reports hearing heartbeats in the ear, likely due to anatomical proximity of blood vessels to the eardrum, not indicative of a dangerous condition. - Ordered coronary CT  scan to assess coronary artery anatomy and blood flow.  Hyperlipidemia - Currently on Crestor  20 mg once a day.  With coronary calcium , ideal LDL is less than 70.  On 07/01/2023 LDL was 71.  Currently taking low-dose aspirin  81 mg a day.  Primary hypertension - Continue with hydrochlorothiazide  12.5 mg once a day well-controlled.  - Scheduled follow-up appointment in one year.         Dispo: 1 yr APP, will also follow-up with  testing  Signed, Oneil Parchment, MD

## 2023-12-26 ENCOUNTER — Other Ambulatory Visit (HOSPITAL_COMMUNITY): Payer: Self-pay

## 2023-12-29 ENCOUNTER — Encounter (HOSPITAL_COMMUNITY): Payer: Self-pay

## 2023-12-31 ENCOUNTER — Other Ambulatory Visit (HOSPITAL_COMMUNITY): Payer: Self-pay

## 2023-12-31 ENCOUNTER — Telehealth: Payer: Self-pay | Admitting: Cardiology

## 2023-12-31 MED ORDER — METOPROLOL TARTRATE 50 MG PO TABS
50.0000 mg | ORAL_TABLET | ORAL | 0 refills | Status: DC
  Filled 2023-12-31: qty 1, 1d supply, fill #0

## 2023-12-31 NOTE — Telephone Encounter (Signed)
*  STAT* If patient is at the pharmacy, call can be transferred to refill team.   1. Which medications need to be refilled? (please list name of each medication and dose if known) metoprolol tartrate (LOPRESSOR) 50 MG tablet    2. Would you like to learn more about the convenience, safety, & potential cost savings by using the [facility]?      3. Are you open to using the [facility] (Type [facility].  ).   4. Which pharmacy/location (including street and city if local pharmacy) is medication to be sent to? [place] [facility]    5. Do they need a 30 day or 90 day supply? 30 day

## 2024-01-01 ENCOUNTER — Ambulatory Visit (HOSPITAL_COMMUNITY)
Admission: RE | Admit: 2024-01-01 | Discharge: 2024-01-01 | Disposition: A | Discharge status: Home / Self Care | Source: Ambulatory Visit | Attending: Student in an Organized Health Care Education/Training Program | Admitting: Student in an Organized Health Care Education/Training Program

## 2024-01-01 DIAGNOSIS — I2510 Atherosclerotic heart disease of native coronary artery without angina pectoris: Secondary | ICD-10-CM | POA: Insufficient documentation

## 2024-01-01 DIAGNOSIS — R072 Precordial pain: Secondary | ICD-10-CM | POA: Insufficient documentation

## 2024-01-01 MED ORDER — IOHEXOL 350 MG/ML SOLN
100.0000 mL | Freq: Once | INTRAVENOUS | Status: AC | PRN
  Administered 2024-01-01: 100 mL via INTRAVENOUS

## 2024-01-01 MED ORDER — NITROGLYCERIN 0.4 MG SL SUBL
0.8000 mg | SUBLINGUAL_TABLET | Freq: Once | SUBLINGUAL | Status: AC
  Administered 2024-01-01: 0.8 mg via SUBLINGUAL

## 2024-01-05 ENCOUNTER — Ambulatory Visit: Payer: Self-pay | Admitting: Cardiology

## 2024-01-07 ENCOUNTER — Other Ambulatory Visit

## 2024-01-15 ENCOUNTER — Encounter (HOSPITAL_COMMUNITY): Payer: Self-pay

## 2024-01-15 ENCOUNTER — Other Ambulatory Visit (HOSPITAL_COMMUNITY): Payer: Self-pay

## 2024-01-15 MED ORDER — CYCLOBENZAPRINE HCL 5 MG PO TABS
51.0000 mg | ORAL_TABLET | Freq: Every day | ORAL | 0 refills | Status: AC | PRN
  Filled 2024-01-15: qty 30, 30d supply, fill #0

## 2024-01-16 ENCOUNTER — Other Ambulatory Visit (HOSPITAL_COMMUNITY): Payer: Self-pay

## 2024-01-16 MED ORDER — ALPRAZOLAM 0.25 MG PO TABS
0.2500 mg | ORAL_TABLET | Freq: Three times a day (TID) | ORAL | 0 refills | Status: AC | PRN
  Filled 2024-01-16: qty 30, 10d supply, fill #0

## 2024-02-24 ENCOUNTER — Other Ambulatory Visit (HOSPITAL_COMMUNITY): Payer: Self-pay

## 2024-03-19 ENCOUNTER — Other Ambulatory Visit: Payer: Self-pay | Admitting: Family Medicine

## 2024-03-19 DIAGNOSIS — Z1231 Encounter for screening mammogram for malignant neoplasm of breast: Secondary | ICD-10-CM

## 2024-04-26 ENCOUNTER — Ambulatory Visit

## 2024-06-22 ENCOUNTER — Ambulatory Visit: Admitting: Nurse Practitioner
